# Patient Record
Sex: Female | Born: 1950
Health system: Southern US, Community
[De-identification: ages and names within clinical notes are randomized; demographics above are authoritative.]

## PROBLEM LIST (undated history)

## (undated) DIAGNOSIS — J449 Chronic obstructive pulmonary disease, unspecified: Secondary | ICD-10-CM

## (undated) DIAGNOSIS — F32A Depression, unspecified: Secondary | ICD-10-CM

## (undated) DIAGNOSIS — F329 Major depressive disorder, single episode, unspecified: Secondary | ICD-10-CM

## (undated) DIAGNOSIS — M8580 Other specified disorders of bone density and structure, unspecified site: Secondary | ICD-10-CM

## (undated) HISTORY — DX: Major depressive disorder, single episode, unspecified: F32.9

## (undated) HISTORY — PX: APPENDECTOMY: SHX54

## (undated) HISTORY — PX: ABDOMINAL HYSTERECTOMY: SHX81

## (undated) HISTORY — DX: Depression, unspecified: F32.A

## (undated) HISTORY — DX: Other specified disorders of bone density and structure, unspecified site: M85.80

---

## 1998-05-28 ENCOUNTER — Other Ambulatory Visit: Admission: RE | Admit: 1998-05-28 | Discharge: 1998-05-28 | Payer: Self-pay

## 1999-04-10 ENCOUNTER — Ambulatory Visit (HOSPITAL_COMMUNITY): Admission: RE | Admit: 1999-04-10 | Discharge: 1999-04-10 | Payer: Self-pay | Admitting: Gastroenterology

## 1999-06-17 ENCOUNTER — Other Ambulatory Visit: Admission: RE | Admit: 1999-06-17 | Discharge: 1999-06-17 | Payer: Self-pay | Admitting: *Deleted

## 2000-06-02 ENCOUNTER — Other Ambulatory Visit: Admission: RE | Admit: 2000-06-02 | Discharge: 2000-06-02 | Payer: Self-pay | Admitting: *Deleted

## 2001-06-24 ENCOUNTER — Other Ambulatory Visit: Admission: RE | Admit: 2001-06-24 | Discharge: 2001-06-24 | Payer: Self-pay | Admitting: *Deleted

## 2002-07-26 ENCOUNTER — Other Ambulatory Visit: Admission: RE | Admit: 2002-07-26 | Discharge: 2002-07-26 | Payer: Self-pay | Admitting: Gynecology

## 2003-08-01 ENCOUNTER — Other Ambulatory Visit: Admission: RE | Admit: 2003-08-01 | Discharge: 2003-08-01 | Payer: Self-pay | Admitting: Gynecology

## 2004-08-20 ENCOUNTER — Other Ambulatory Visit: Admission: RE | Admit: 2004-08-20 | Discharge: 2004-08-20 | Payer: Self-pay | Admitting: Gynecology

## 2005-09-02 ENCOUNTER — Other Ambulatory Visit: Admission: RE | Admit: 2005-09-02 | Discharge: 2005-09-02 | Payer: Self-pay | Admitting: Gynecology

## 2006-09-07 ENCOUNTER — Other Ambulatory Visit: Admission: RE | Admit: 2006-09-07 | Discharge: 2006-09-07 | Payer: Self-pay | Admitting: Gynecology

## 2014-07-06 ENCOUNTER — Other Ambulatory Visit: Payer: Self-pay | Admitting: Family Medicine

## 2014-07-06 DIAGNOSIS — R200 Anesthesia of skin: Secondary | ICD-10-CM

## 2014-07-17 ENCOUNTER — Ambulatory Visit
Admission: RE | Admit: 2014-07-17 | Discharge: 2014-07-17 | Disposition: A | Discharge status: Home / Self Care | Payer: Commercial Managed Care - HMO | Source: Ambulatory Visit | Attending: Family Medicine | Admitting: Family Medicine

## 2014-07-17 DIAGNOSIS — R200 Anesthesia of skin: Secondary | ICD-10-CM

## 2014-08-11 ENCOUNTER — Encounter: Payer: Self-pay | Admitting: Surgery

## 2014-08-21 ENCOUNTER — Encounter: Payer: Commercial Managed Care - HMO | Admitting: Surgery

## 2014-08-25 ENCOUNTER — Encounter: Payer: Self-pay | Admitting: Surgery

## 2014-08-28 ENCOUNTER — Ambulatory Visit (INDEPENDENT_AMBULATORY_CARE_PROVIDER_SITE_OTHER): Payer: Commercial Managed Care - HMO | Admitting: Surgery

## 2014-08-28 ENCOUNTER — Encounter: Payer: Self-pay | Admitting: Surgery

## 2014-08-28 VITALS — BP 136/90 | HR 66 | Ht 64.0 in | Wt 164.1 lb

## 2014-08-28 DIAGNOSIS — I739 Peripheral vascular disease, unspecified: Secondary | ICD-10-CM

## 2014-08-28 NOTE — Progress Notes (Signed)
Patient name: Grace Nelson MRN: [id number] DOB: January 21, 1951 Sex: female   Referred by: Marda Stalker  Reason for referral:  Chief Complaint  Patient presents with  . New Evaluation    eval decrease sensation questionable claudication    HISTORY OF PRESENT ILLNESS: This is a very pleasant 63 year old female who is referred today for a possible claudication.  The patient states that for the past year she has been complaining of foot issues.  She states that occasionally she will get numbness in both of her feet.  The left leg bothers her more so than the right.  Also she describes as feeling as if her feet her on fire.  There are no aggravating or relieving factors.  She does not endorse classic claudication symptoms.  She exercises regularly, 3 times a week.  The patient does have a history of smoking.  She does not endorse nonhealing wounds.  She denies having any swelling in her legs.  She states that she is not diabetic.  Infection I suffers from arthritis and occasional allergy for which he takes daily medication.  Past Medical History  Diagnosis Date  . Depression   . Osteopenia     Past Surgical History  Procedure Laterality Date  . Appendectomy    . Abdominal hysterectomy      History   Social History  . Marital Status: Legally Separated    Spouse Name: N/A    Number of Children: N/A  . Years of Education: N/A   Occupational History  . Not on file.   Social History Main Topics  . Smoking status: Current Every Day Smoker -- 0.75 packs/day    Types: Cigarettes  . Smokeless tobacco: Never Used  . Alcohol Use: No  . Drug Use: No  . Sexual Activity: Not on file   Other Topics Concern  . Not on file   Social History Narrative  . No narrative on file    Family History  Problem Relation Age of Onset  . Ulcers Mother     pancreatic bleeding ulcer  . Hypertension Mother   . Cancer Mother     colon  . COPD Father   . Cancer Father     lung  .  Heart disease Father     before age 17  . Heart attack Father   . Kidney disease Brother     Allergies as of 08/28/2014 - Review Complete 08/28/2014  Allergen Reaction Noted  . Nsaids  08/11/2014  . Sulfa antibiotics Hives 08/11/2014    Current Outpatient Prescriptions on File Prior to Visit  Medication Sig Dispense Refill  . acetaminophen (TYLENOL) 650 MG CR tablet Take 650 mg by mouth every 8 (eight) hours as needed for pain.      . cetirizine (ZYRTEC) 10 MG tablet Take 10 mg by mouth daily.       No current facility-administered medications on file prior to visit.     REVIEW OF SYSTEMS: Please see history of present illness, otherwise all systems negative  PHYSICAL EXAMINATION: General: The patient appears their stated age.  Vital signs are BP 136/90  Pulse 66  Ht 5\' 4"  (1.626 m)  Wt 164 lb 1.6 oz (74.435 kg)  BMI 28.15 kg/m2  SpO2 98% HEENT:  No gross abnormalities Pulmonary: Respirations are non-labored Musculoskeletal: There are no major deformities.   Neurologic: No focal weakness or paresthesias are detected, Skin: There are no ulcer or rashes noted. Psychiatric: The patient has normal  affect. Cardiovascular: Palpable posterior tibial pulse bilaterally.  No edema.  No ulcers.  Diagnostic Studies: I have reviewed her ultrasound.  She has triphasic waveforms bilaterally with normal ankle-brachial indices   Assessment:  Bilateral foot pain Plan: I do not think that the patient's symptoms are vascular in origin.  She has normal ankle-brachial indices with palpable pedal pulses.  In addition, she does not endorse classic claudication symptoms.  Although the patient is not diabetic, I would favor this being more neurogenic in origin.  This appears to be neuropathy in my opinion.  I have recommended a trial of Neurontin, however the patient states that her symptoms are not bad enough for her to want to take a medication.  She'll follow with me on an as-needed basis I  will contact her primary care physician should she decide to take Neurontin     V. Leia Alf, M.D. [facility] Office: [phone] Pager:  [phone]

## 2015-04-01 DIAGNOSIS — R21 Rash and other nonspecific skin eruption: Secondary | ICD-10-CM | POA: Diagnosis not present

## 2015-04-20 DIAGNOSIS — J069 Acute upper respiratory infection, unspecified: Secondary | ICD-10-CM | POA: Diagnosis not present

## 2015-07-12 DIAGNOSIS — E559 Vitamin D deficiency, unspecified: Secondary | ICD-10-CM | POA: Diagnosis not present

## 2015-07-12 DIAGNOSIS — Z0000 Encounter for general adult medical examination without abnormal findings: Secondary | ICD-10-CM | POA: Diagnosis not present

## 2015-07-13 DIAGNOSIS — G629 Polyneuropathy, unspecified: Secondary | ICD-10-CM | POA: Diagnosis not present

## 2015-07-13 DIAGNOSIS — E559 Vitamin D deficiency, unspecified: Secondary | ICD-10-CM | POA: Diagnosis not present

## 2015-07-13 DIAGNOSIS — Z0000 Encounter for general adult medical examination without abnormal findings: Secondary | ICD-10-CM | POA: Diagnosis not present

## 2015-07-13 DIAGNOSIS — Z23 Encounter for immunization: Secondary | ICD-10-CM | POA: Diagnosis not present

## 2015-07-13 DIAGNOSIS — F17210 Nicotine dependence, cigarettes, uncomplicated: Secondary | ICD-10-CM | POA: Diagnosis not present

## 2015-07-18 ENCOUNTER — Encounter: Payer: Self-pay | Admitting: Acute Care

## 2015-07-18 ENCOUNTER — Telehealth: Payer: Self-pay | Admitting: Acute Care

## 2015-07-18 NOTE — Telephone Encounter (Signed)
I have scheduled Grace Nelson for a lung cancer screening on 08/01/15 . She will have a shared decision making visit with me at 10 am and then have the scan at 11 am. She verbalized understanding of time and location of both appointments.She has my contact information in the event she needs to change the appointment or has any questions.

## 2015-07-20 ENCOUNTER — Other Ambulatory Visit: Payer: Self-pay | Admitting: Acute Care

## 2015-07-20 DIAGNOSIS — F17210 Nicotine dependence, cigarettes, uncomplicated: Principal | ICD-10-CM

## 2015-08-01 ENCOUNTER — Ambulatory Visit (INDEPENDENT_AMBULATORY_CARE_PROVIDER_SITE_OTHER)
Admission: RE | Admit: 2015-08-01 | Discharge: 2015-08-01 | Disposition: A | Discharge status: Home / Self Care | Payer: Commercial Managed Care - HMO | Source: Ambulatory Visit | Attending: Acute Care | Admitting: Acute Care

## 2015-08-01 ENCOUNTER — Encounter: Payer: Self-pay | Admitting: Acute Care

## 2015-08-01 ENCOUNTER — Ambulatory Visit (INDEPENDENT_AMBULATORY_CARE_PROVIDER_SITE_OTHER): Payer: Commercial Managed Care - HMO | Admitting: Acute Care

## 2015-08-01 DIAGNOSIS — F17210 Nicotine dependence, cigarettes, uncomplicated: Secondary | ICD-10-CM | POA: Diagnosis not present

## 2015-08-01 DIAGNOSIS — Z87891 Personal history of nicotine dependence: Secondary | ICD-10-CM | POA: Diagnosis not present

## 2015-08-01 NOTE — Progress Notes (Signed)
Shared Decision Making Visit Lung Cancer Screening Program [phone])   Eligibility:  Age 64 y.o.  Pack Years Smoking History Calculation 45 pack years (# packs/per year x # years smoked)  Recent History of coughing up blood  no  Unexplained weight loss? no ( >Than 15 pounds within the last 6 months )  Prior History Lung / other cancer no (Diagnosis within the last 5 years already requiring surveillance chest CT Scans).  Smoking Status Current Smoker  Former Smokers: Years since quit: NA  Quit Date: NA  Visit Components:  Discussion included one or more decision making aids. yes  Discussion included risk/benefits of screening. yes  Discussion included potential follow up diagnostic testing for abnormal scans. yes  Discussion included meaning and risk of over diagnosis. yes  Discussion included meaning and risk of False Positives. yes  Discussion included meaning of total radiation exposure. yes  Counseling Included:  Importance of adherence to annual lung cancer LDCT screening. yes  Impact of comorbidities on ability to participate in the program. yes  Ability and willingness to under diagnostic treatment. yes  Smoking Cessation Counseling:  Current Smokers:   Discussed importance of smoking cessation. yes  Information about tobacco cessation classes and interventions provided to patient. yes  Patient provided with "ticket" for LDCT Scan. yes  Symptomatic Patient. yes  Counseling: No  Diagnosis Code: Tobacco Use Z72.0  Asymptomatic Patient yes  Counseling (Intermediate counseling: > three minutes counseling) X5170  Former Smokers: NA, Current smoker  Discussed the importance of maintaining cigarette abstinence. NA  Diagnosis Code: Personal History of Nicotine Dependence. Y17.494  Information about tobacco cessation classes and interventions provided to patient. NA  Patient provided with "ticket" for LDCT Scan. yes  Written Order for Lung Cancer  Screening with LDCT placed in Epic. Yes (CT Chest Lung Cancer Screening Low Dose W/O CM) [id number] Z12.2-Screening of respiratory organs Z87.891-Personal history of nicotine dependence  I spent 15 minutes of face to face time with Ms. Furguson discussing the risks and benefits of the  lung cancer screening program. We viewed a power point together, pausing and stopping at intervals to allow for questions to be asked and answered to ensure understanding of all content topic notes above. We discussed the fact that the most powerful thing she can do to decrease her chance of developing lung cancer is to quit smoking. I have given her the " Be stronger than your excuses" card and we discussed nicotine replacement therapy. She has tried the patches which caused contact blistering, and the mints which made her nauseated. We discussed the use of Wellbutrin and Chantix. She has tried Wellbutrin unsuccessfully, and is considering the use of Chantix. She is working with her PCP on this. I also suggested the nicotine aerosol as an option. I have told her if she needs help with prescriptions to help with meeting this goal to let me know. I have given her a copy of the power point we have viewed together to refer to in the future, and my card with my contact information. She verbalized understanding of the time and location of her scan, and all of the above information noted above. She had no further questions upon leaving my office. She is very supportive of this program as she lost her best friend to lung cancer that was diagnosed at a late stage in April.   Magdalen Spatz, NP

## 2015-08-03 DIAGNOSIS — Z780 Asymptomatic menopausal state: Secondary | ICD-10-CM | POA: Diagnosis not present

## 2015-08-03 DIAGNOSIS — Z1231 Encounter for screening mammogram for malignant neoplasm of breast: Secondary | ICD-10-CM | POA: Diagnosis not present

## 2015-08-15 ENCOUNTER — Ambulatory Visit (INDEPENDENT_AMBULATORY_CARE_PROVIDER_SITE_OTHER): Payer: Commercial Managed Care - HMO | Admitting: Neurology

## 2015-08-15 DIAGNOSIS — R202 Paresthesia of skin: Secondary | ICD-10-CM

## 2015-08-15 DIAGNOSIS — Z0289 Encounter for other administrative examinations: Secondary | ICD-10-CM

## 2015-08-15 NOTE — Progress Notes (Signed)
  [facility]    Provider:  Dr [name] Referring Provider: [name], MD Primary Care Physician:  [name], MD  HPI:  Grace Nelson is a 64 y.o. female here as a referral from Dr. Sabra Heck for peripheral neuropathy.  Symptoms started 2 years ago. Her feet burn, they feel hot, painful. Just the bottom of the feet. Balance is fine. She exercises three times a week. No weakness. She has rare cramping at night. Feet are worse at night.   Summary  Nerve conduction studies were performed on the bilateral lower extremities:  The bilateral Peroneal motor nerves showed normal conductions with normal F Wave latencies The bilateral Tibial motor nerves showed normal conductions with normal F Wave latencies The bilateral Sural sensory nerve conductions were within normal limits Bilateral H Reflexes showed normal latencies   EMG Needle study was performed on selected lower extremity muscles:   The Vastus Medialis, Anterior Tibialis, Medial Gastrocnemius, Extensor Hallucis Longus and Abductor Hallucis muscles were within normal limits bilaterally.  Conclusion: This is a normal study. There is no evidence of polyneuropathy however a small fiber neuropathy could be responsible for patient's symptoms and still evade detection by this study. Clinical correlation recommended.     Assessment/Plan:    [name], MD  [facility] [address] [place], [place] [address]  Phone [phone] Fax [phone]

## 2015-08-16 NOTE — Procedures (Signed)
[facility]    Provider:  Dr [name] Referring Provider: [name], MD Primary Care Physician:  [name], MD  HPI:  Grace Nelson is a 64 y.o. female here as a referral from Dr. Sabra Heck for peripheral neuropathy.  Symptoms started 2 years ago. Her feet burn, they feel hot, painful. Just the bottom of the feet. Balance is fine. She exercises three times a week. No weakness. She has rare cramping at night. Feet are worse at night.   Summary  Nerve conduction studies were performed on the bilateral lower extremities:  The bilateral Peroneal motor nerves showed normal conductions with normal F Wave latencies The bilateral Tibial motor nerves showed normal conductions with normal F Wave latencies The bilateral Sural sensory nerve conductions were within normal limits Bilateral H Reflexes showed normal latencies   EMG Needle study was performed on selected lower extremity muscles:   The Vastus Medialis, Anterior Tibialis, Medial Gastrocnemius, Extensor Hallucis Longus and Abductor Hallucis muscles were within normal limits bilaterally.  Conclusion: This is a normal study. There is no evidence of polyneuropathy however a small fiber neuropathy could be responsible for patient's symptoms and still evade detection by this study. Clinical correlation recommended.     [name], MD  [facility] [address] [place], [place] [address]  Phone [phone] Fax [phone]

## 2015-08-16 NOTE — Progress Notes (Signed)
See procedure note.

## 2015-11-13 DIAGNOSIS — E785 Hyperlipidemia, unspecified: Secondary | ICD-10-CM | POA: Diagnosis not present

## 2015-11-19 DIAGNOSIS — R197 Diarrhea, unspecified: Secondary | ICD-10-CM | POA: Diagnosis not present

## 2015-11-19 DIAGNOSIS — E785 Hyperlipidemia, unspecified: Secondary | ICD-10-CM | POA: Diagnosis not present

## 2015-11-19 DIAGNOSIS — R195 Other fecal abnormalities: Secondary | ICD-10-CM | POA: Diagnosis not present

## 2015-11-20 DIAGNOSIS — R195 Other fecal abnormalities: Secondary | ICD-10-CM | POA: Diagnosis not present

## 2016-02-20 ENCOUNTER — Other Ambulatory Visit: Payer: Self-pay | Admitting: Acute Care

## 2016-02-20 DIAGNOSIS — F17210 Nicotine dependence, cigarettes, uncomplicated: Principal | ICD-10-CM

## 2016-07-31 DIAGNOSIS — F17210 Nicotine dependence, cigarettes, uncomplicated: Secondary | ICD-10-CM | POA: Diagnosis not present

## 2016-07-31 DIAGNOSIS — Z23 Encounter for immunization: Secondary | ICD-10-CM | POA: Diagnosis not present

## 2016-07-31 DIAGNOSIS — I2510 Atherosclerotic heart disease of native coronary artery without angina pectoris: Secondary | ICD-10-CM | POA: Diagnosis not present

## 2016-07-31 DIAGNOSIS — Z79899 Other long term (current) drug therapy: Secondary | ICD-10-CM | POA: Diagnosis not present

## 2016-07-31 DIAGNOSIS — Z1159 Encounter for screening for other viral diseases: Secondary | ICD-10-CM | POA: Diagnosis not present

## 2016-07-31 DIAGNOSIS — Z86010 Personal history of colonic polyps: Secondary | ICD-10-CM | POA: Diagnosis not present

## 2016-07-31 DIAGNOSIS — M8580 Other specified disorders of bone density and structure, unspecified site: Secondary | ICD-10-CM | POA: Diagnosis not present

## 2016-07-31 DIAGNOSIS — E559 Vitamin D deficiency, unspecified: Secondary | ICD-10-CM | POA: Diagnosis not present

## 2016-07-31 DIAGNOSIS — Z0000 Encounter for general adult medical examination without abnormal findings: Secondary | ICD-10-CM | POA: Diagnosis not present

## 2016-08-01 ENCOUNTER — Ambulatory Visit (INDEPENDENT_AMBULATORY_CARE_PROVIDER_SITE_OTHER)
Admission: RE | Admit: 2016-08-01 | Discharge: 2016-08-01 | Disposition: A | Discharge status: Home / Self Care | Payer: Commercial Managed Care - HMO | Source: Ambulatory Visit | Attending: Acute Care | Admitting: Acute Care

## 2016-08-01 DIAGNOSIS — Z87891 Personal history of nicotine dependence: Secondary | ICD-10-CM | POA: Diagnosis not present

## 2016-08-01 DIAGNOSIS — F17210 Nicotine dependence, cigarettes, uncomplicated: Principal | ICD-10-CM

## 2016-08-06 ENCOUNTER — Telehealth: Payer: Self-pay | Admitting: Acute Care

## 2016-08-06 DIAGNOSIS — F17210 Nicotine dependence, cigarettes, uncomplicated: Principal | ICD-10-CM

## 2016-08-06 NOTE — Telephone Encounter (Signed)
I attempted to call Mr. Rabkin with her low-dose CT screening results. There was no answer at either her home or mobile cell number. I have left a message with contact information requesting that she call the office for the results. We will await her return call.

## 2016-08-06 NOTE — Telephone Encounter (Signed)
Grace Nelson returned our call. I have given her the results of her low-dose CT screening. I explained to her that her scan was read as a Lung RADS 2: Indicating nodules that are benign in appearance and behavior with a very low likelihood of becoming a clinically active cancer due to size or lack of growth. Recommendation per radiology is for a repeat LDCT in 12 months. I explained to her that we would schedule her annual scan in 12 months. I additionally explained to her that her scan indicated two-vessel coronary artery atherosclerosis. This reported actually been given in her previous year scan, and subsequently her primary care physician Dr. [name] had initiated statin therapy. Patient verbalized understanding of the above and had no further questions or concerns at the conclusion of the call.

## 2016-08-08 DIAGNOSIS — Z1231 Encounter for screening mammogram for malignant neoplasm of breast: Secondary | ICD-10-CM | POA: Diagnosis not present

## 2017-08-04 ENCOUNTER — Ambulatory Visit (INDEPENDENT_AMBULATORY_CARE_PROVIDER_SITE_OTHER)
Admission: RE | Admit: 2017-08-04 | Discharge: 2017-08-04 | Disposition: A | Discharge status: Home / Self Care | Payer: Medicare HMO | Source: Ambulatory Visit | Attending: Acute Care | Admitting: Acute Care

## 2017-08-04 DIAGNOSIS — J439 Emphysema, unspecified: Secondary | ICD-10-CM | POA: Diagnosis not present

## 2017-08-04 DIAGNOSIS — F17210 Nicotine dependence, cigarettes, uncomplicated: Secondary | ICD-10-CM | POA: Diagnosis not present

## 2017-08-05 DIAGNOSIS — I700 Atherosclerosis of aorta: Secondary | ICD-10-CM | POA: Diagnosis not present

## 2017-08-05 DIAGNOSIS — E559 Vitamin D deficiency, unspecified: Secondary | ICD-10-CM | POA: Diagnosis not present

## 2017-08-05 DIAGNOSIS — Z79899 Other long term (current) drug therapy: Secondary | ICD-10-CM | POA: Diagnosis not present

## 2017-08-05 DIAGNOSIS — R8290 Unspecified abnormal findings in urine: Secondary | ICD-10-CM | POA: Diagnosis not present

## 2017-08-05 DIAGNOSIS — Z0000 Encounter for general adult medical examination without abnormal findings: Secondary | ICD-10-CM | POA: Diagnosis not present

## 2017-08-05 DIAGNOSIS — I2510 Atherosclerotic heart disease of native coronary artery without angina pectoris: Secondary | ICD-10-CM | POA: Diagnosis not present

## 2017-08-05 DIAGNOSIS — R635 Abnormal weight gain: Secondary | ICD-10-CM | POA: Diagnosis not present

## 2017-08-05 DIAGNOSIS — Z23 Encounter for immunization: Secondary | ICD-10-CM | POA: Diagnosis not present

## 2017-08-05 DIAGNOSIS — Z1389 Encounter for screening for other disorder: Secondary | ICD-10-CM | POA: Diagnosis not present

## 2017-08-07 ENCOUNTER — Other Ambulatory Visit: Payer: Self-pay | Admitting: Acute Care

## 2017-08-07 DIAGNOSIS — Z122 Encounter for screening for malignant neoplasm of respiratory organs: Secondary | ICD-10-CM

## 2017-08-07 DIAGNOSIS — F17210 Nicotine dependence, cigarettes, uncomplicated: Principal | ICD-10-CM

## 2017-08-13 DIAGNOSIS — Z1231 Encounter for screening mammogram for malignant neoplasm of breast: Secondary | ICD-10-CM | POA: Diagnosis not present

## 2017-08-18 ENCOUNTER — Other Ambulatory Visit: Payer: Self-pay | Admitting: Internal Medicine

## 2017-08-18 DIAGNOSIS — R0600 Dyspnea, unspecified: Secondary | ICD-10-CM

## 2017-08-19 ENCOUNTER — Ambulatory Visit (INDEPENDENT_AMBULATORY_CARE_PROVIDER_SITE_OTHER): Payer: Medicare HMO | Admitting: Internal Medicine

## 2017-08-19 DIAGNOSIS — R0600 Dyspnea, unspecified: Secondary | ICD-10-CM

## 2017-08-19 LAB — PULMONARY FUNCTION TEST
DL/VA % pred: 42 %
DL/VA: 2.03 ml/min/mmHg/L
DLCO COR: 8.2 ml/min/mmHg
DLCO cor % pred: 33 %
DLCO unc % pred: 35 %
DLCO unc: 8.51 ml/min/mmHg
FEF 25-75 Post: 0.55 L/sec
FEF 25-75 Pre: 0.48 L/sec
FEF2575-%Change-Post: 12 %
FEF2575-%PRED-PRE: 24 %
FEF2575-%Pred-Post: 27 %
FEV1-%Change-Post: -1 %
FEV1-%PRED-PRE: 53 %
FEV1-%Pred-Post: 53 %
FEV1-POST: 1.23 L
FEV1-PRE: 1.25 L
FEV1FVC-%Change-Post: -1 %
FEV1FVC-%Pred-Pre: 79 %
FEV6-%Change-Post: 0 %
FEV6-%PRED-PRE: 68 %
FEV6-%Pred-Post: 68 %
FEV6-POST: 2.01 L
FEV6-Pre: 2.01 L
FEV6FVC-%Change-Post: 0 %
FEV6FVC-%PRED-PRE: 101 %
FEV6FVC-%Pred-Post: 101 %
FVC-%Change-Post: 0 %
FVC-%PRED-POST: 67 %
FVC-%PRED-PRE: 67 %
FVC-POST: 2.07 L
FVC-PRE: 2.07 L
POST FEV6/FVC RATIO: 97 %
PRE FEV1/FVC RATIO: 60 %
Post FEV1/FVC ratio: 59 %
Pre FEV6/FVC Ratio: 97 %
RV % PRED: 146 %
RV: 3.15 L
TLC % PRED: 106 %
TLC: 5.37 L

## 2017-08-19 NOTE — Progress Notes (Signed)
PFT done today. 

## 2018-01-13 DIAGNOSIS — M7501 Adhesive capsulitis of right shoulder: Secondary | ICD-10-CM | POA: Diagnosis not present

## 2018-01-13 DIAGNOSIS — M25519 Pain in unspecified shoulder: Secondary | ICD-10-CM | POA: Diagnosis not present

## 2018-01-14 DIAGNOSIS — M79621 Pain in right upper arm: Secondary | ICD-10-CM | POA: Diagnosis not present

## 2018-01-14 DIAGNOSIS — M7501 Adhesive capsulitis of right shoulder: Secondary | ICD-10-CM | POA: Diagnosis not present

## 2018-01-14 DIAGNOSIS — M25611 Stiffness of right shoulder, not elsewhere classified: Secondary | ICD-10-CM | POA: Diagnosis not present

## 2018-01-14 DIAGNOSIS — M25511 Pain in right shoulder: Secondary | ICD-10-CM | POA: Diagnosis not present

## 2018-01-19 DIAGNOSIS — M25611 Stiffness of right shoulder, not elsewhere classified: Secondary | ICD-10-CM | POA: Diagnosis not present

## 2018-01-19 DIAGNOSIS — M25511 Pain in right shoulder: Secondary | ICD-10-CM | POA: Diagnosis not present

## 2018-01-19 DIAGNOSIS — M79621 Pain in right upper arm: Secondary | ICD-10-CM | POA: Diagnosis not present

## 2018-01-19 DIAGNOSIS — M7501 Adhesive capsulitis of right shoulder: Secondary | ICD-10-CM | POA: Diagnosis not present

## 2018-01-21 DIAGNOSIS — M79621 Pain in right upper arm: Secondary | ICD-10-CM | POA: Diagnosis not present

## 2018-01-21 DIAGNOSIS — M25611 Stiffness of right shoulder, not elsewhere classified: Secondary | ICD-10-CM | POA: Diagnosis not present

## 2018-01-21 DIAGNOSIS — M25511 Pain in right shoulder: Secondary | ICD-10-CM | POA: Diagnosis not present

## 2018-01-21 DIAGNOSIS — M7501 Adhesive capsulitis of right shoulder: Secondary | ICD-10-CM | POA: Diagnosis not present

## 2018-01-26 DIAGNOSIS — M25511 Pain in right shoulder: Secondary | ICD-10-CM | POA: Diagnosis not present

## 2018-01-26 DIAGNOSIS — M7501 Adhesive capsulitis of right shoulder: Secondary | ICD-10-CM | POA: Diagnosis not present

## 2018-01-26 DIAGNOSIS — M79621 Pain in right upper arm: Secondary | ICD-10-CM | POA: Diagnosis not present

## 2018-01-26 DIAGNOSIS — M25611 Stiffness of right shoulder, not elsewhere classified: Secondary | ICD-10-CM | POA: Diagnosis not present

## 2018-01-27 DIAGNOSIS — M25611 Stiffness of right shoulder, not elsewhere classified: Secondary | ICD-10-CM | POA: Diagnosis not present

## 2018-01-27 DIAGNOSIS — M7501 Adhesive capsulitis of right shoulder: Secondary | ICD-10-CM | POA: Diagnosis not present

## 2018-01-27 DIAGNOSIS — M25511 Pain in right shoulder: Secondary | ICD-10-CM | POA: Diagnosis not present

## 2018-01-27 DIAGNOSIS — M79621 Pain in right upper arm: Secondary | ICD-10-CM | POA: Diagnosis not present

## 2018-02-03 DIAGNOSIS — M79621 Pain in right upper arm: Secondary | ICD-10-CM | POA: Diagnosis not present

## 2018-02-03 DIAGNOSIS — M7501 Adhesive capsulitis of right shoulder: Secondary | ICD-10-CM | POA: Diagnosis not present

## 2018-02-03 DIAGNOSIS — M25511 Pain in right shoulder: Secondary | ICD-10-CM | POA: Diagnosis not present

## 2018-02-03 DIAGNOSIS — M25611 Stiffness of right shoulder, not elsewhere classified: Secondary | ICD-10-CM | POA: Diagnosis not present

## 2018-02-05 DIAGNOSIS — M25611 Stiffness of right shoulder, not elsewhere classified: Secondary | ICD-10-CM | POA: Diagnosis not present

## 2018-02-05 DIAGNOSIS — M7501 Adhesive capsulitis of right shoulder: Secondary | ICD-10-CM | POA: Diagnosis not present

## 2018-02-05 DIAGNOSIS — M25511 Pain in right shoulder: Secondary | ICD-10-CM | POA: Diagnosis not present

## 2018-02-05 DIAGNOSIS — M79621 Pain in right upper arm: Secondary | ICD-10-CM | POA: Diagnosis not present

## 2018-02-08 DIAGNOSIS — M7501 Adhesive capsulitis of right shoulder: Secondary | ICD-10-CM | POA: Diagnosis not present

## 2018-02-08 DIAGNOSIS — M79621 Pain in right upper arm: Secondary | ICD-10-CM | POA: Diagnosis not present

## 2018-02-08 DIAGNOSIS — M25511 Pain in right shoulder: Secondary | ICD-10-CM | POA: Diagnosis not present

## 2018-02-08 DIAGNOSIS — M25611 Stiffness of right shoulder, not elsewhere classified: Secondary | ICD-10-CM | POA: Diagnosis not present

## 2018-02-10 DIAGNOSIS — M7501 Adhesive capsulitis of right shoulder: Secondary | ICD-10-CM | POA: Diagnosis not present

## 2018-02-12 DIAGNOSIS — M25511 Pain in right shoulder: Secondary | ICD-10-CM | POA: Diagnosis not present

## 2018-02-12 DIAGNOSIS — M7501 Adhesive capsulitis of right shoulder: Secondary | ICD-10-CM | POA: Diagnosis not present

## 2018-02-12 DIAGNOSIS — M79621 Pain in right upper arm: Secondary | ICD-10-CM | POA: Diagnosis not present

## 2018-02-12 DIAGNOSIS — M25611 Stiffness of right shoulder, not elsewhere classified: Secondary | ICD-10-CM | POA: Diagnosis not present

## 2018-04-05 DIAGNOSIS — L814 Other melanin hyperpigmentation: Secondary | ICD-10-CM | POA: Diagnosis not present

## 2018-04-05 DIAGNOSIS — D2362 Other benign neoplasm of skin of left upper limb, including shoulder: Secondary | ICD-10-CM | POA: Diagnosis not present

## 2018-04-05 DIAGNOSIS — L821 Other seborrheic keratosis: Secondary | ICD-10-CM | POA: Diagnosis not present

## 2018-04-05 DIAGNOSIS — C44311 Basal cell carcinoma of skin of nose: Secondary | ICD-10-CM | POA: Diagnosis not present

## 2018-04-05 DIAGNOSIS — D225 Melanocytic nevi of trunk: Secondary | ICD-10-CM | POA: Diagnosis not present

## 2018-04-05 DIAGNOSIS — D234 Other benign neoplasm of skin of scalp and neck: Secondary | ICD-10-CM | POA: Diagnosis not present

## 2018-04-05 DIAGNOSIS — L820 Inflamed seborrheic keratosis: Secondary | ICD-10-CM | POA: Diagnosis not present

## 2018-04-05 DIAGNOSIS — D2361 Other benign neoplasm of skin of right upper limb, including shoulder: Secondary | ICD-10-CM | POA: Diagnosis not present

## 2018-04-05 DIAGNOSIS — D2372 Other benign neoplasm of skin of left lower limb, including hip: Secondary | ICD-10-CM | POA: Diagnosis not present

## 2018-04-20 DIAGNOSIS — N951 Menopausal and female climacteric states: Secondary | ICD-10-CM | POA: Diagnosis not present

## 2018-04-20 DIAGNOSIS — R635 Abnormal weight gain: Secondary | ICD-10-CM | POA: Diagnosis not present

## 2018-04-22 DIAGNOSIS — Z6831 Body mass index (BMI) 31.0-31.9, adult: Secondary | ICD-10-CM | POA: Diagnosis not present

## 2018-04-22 DIAGNOSIS — Z1339 Encounter for screening examination for other mental health and behavioral disorders: Secondary | ICD-10-CM | POA: Diagnosis not present

## 2018-04-22 DIAGNOSIS — G479 Sleep disorder, unspecified: Secondary | ICD-10-CM | POA: Diagnosis not present

## 2018-04-22 DIAGNOSIS — Z8639 Personal history of other endocrine, nutritional and metabolic disease: Secondary | ICD-10-CM | POA: Diagnosis not present

## 2018-04-22 DIAGNOSIS — N898 Other specified noninflammatory disorders of vagina: Secondary | ICD-10-CM | POA: Diagnosis not present

## 2018-04-22 DIAGNOSIS — R5383 Other fatigue: Secondary | ICD-10-CM | POA: Diagnosis not present

## 2018-04-22 DIAGNOSIS — Z1331 Encounter for screening for depression: Secondary | ICD-10-CM | POA: Diagnosis not present

## 2018-04-22 DIAGNOSIS — N951 Menopausal and female climacteric states: Secondary | ICD-10-CM | POA: Diagnosis not present

## 2018-04-22 DIAGNOSIS — E782 Mixed hyperlipidemia: Secondary | ICD-10-CM | POA: Diagnosis not present

## 2018-04-29 DIAGNOSIS — Z8639 Personal history of other endocrine, nutritional and metabolic disease: Secondary | ICD-10-CM | POA: Diagnosis not present

## 2018-04-29 DIAGNOSIS — E782 Mixed hyperlipidemia: Secondary | ICD-10-CM | POA: Diagnosis not present

## 2018-04-29 DIAGNOSIS — Z6831 Body mass index (BMI) 31.0-31.9, adult: Secondary | ICD-10-CM | POA: Diagnosis not present

## 2018-04-29 DIAGNOSIS — Z713 Dietary counseling and surveillance: Secondary | ICD-10-CM | POA: Diagnosis not present

## 2018-05-04 DIAGNOSIS — C44311 Basal cell carcinoma of skin of nose: Secondary | ICD-10-CM | POA: Diagnosis not present

## 2018-05-04 DIAGNOSIS — Z85828 Personal history of other malignant neoplasm of skin: Secondary | ICD-10-CM | POA: Diagnosis not present

## 2018-05-06 DIAGNOSIS — Z8639 Personal history of other endocrine, nutritional and metabolic disease: Secondary | ICD-10-CM | POA: Diagnosis not present

## 2018-05-06 DIAGNOSIS — Z6831 Body mass index (BMI) 31.0-31.9, adult: Secondary | ICD-10-CM | POA: Diagnosis not present

## 2018-05-06 DIAGNOSIS — E669 Obesity, unspecified: Secondary | ICD-10-CM | POA: Diagnosis not present

## 2018-05-06 DIAGNOSIS — Z713 Dietary counseling and surveillance: Secondary | ICD-10-CM | POA: Diagnosis not present

## 2018-05-13 DIAGNOSIS — Z713 Dietary counseling and surveillance: Secondary | ICD-10-CM | POA: Diagnosis not present

## 2018-05-13 DIAGNOSIS — E669 Obesity, unspecified: Secondary | ICD-10-CM | POA: Diagnosis not present

## 2018-05-13 DIAGNOSIS — Z4802 Encounter for removal of sutures: Secondary | ICD-10-CM | POA: Diagnosis not present

## 2018-05-13 DIAGNOSIS — Z6831 Body mass index (BMI) 31.0-31.9, adult: Secondary | ICD-10-CM | POA: Diagnosis not present

## 2018-05-13 DIAGNOSIS — Z8639 Personal history of other endocrine, nutritional and metabolic disease: Secondary | ICD-10-CM | POA: Diagnosis not present

## 2018-05-20 DIAGNOSIS — Z713 Dietary counseling and surveillance: Secondary | ICD-10-CM | POA: Diagnosis not present

## 2018-05-20 DIAGNOSIS — Z8639 Personal history of other endocrine, nutritional and metabolic disease: Secondary | ICD-10-CM | POA: Diagnosis not present

## 2018-05-20 DIAGNOSIS — R5383 Other fatigue: Secondary | ICD-10-CM | POA: Diagnosis not present

## 2018-05-20 DIAGNOSIS — Z6831 Body mass index (BMI) 31.0-31.9, adult: Secondary | ICD-10-CM | POA: Diagnosis not present

## 2018-05-27 DIAGNOSIS — I7090 Unspecified atherosclerosis: Secondary | ICD-10-CM | POA: Diagnosis not present

## 2018-05-27 DIAGNOSIS — Z6831 Body mass index (BMI) 31.0-31.9, adult: Secondary | ICD-10-CM | POA: Diagnosis not present

## 2018-05-27 DIAGNOSIS — Z713 Dietary counseling and surveillance: Secondary | ICD-10-CM | POA: Diagnosis not present

## 2018-08-05 ENCOUNTER — Ambulatory Visit (INDEPENDENT_AMBULATORY_CARE_PROVIDER_SITE_OTHER)
Admission: RE | Admit: 2018-08-05 | Discharge: 2018-08-05 | Disposition: A | Discharge status: Home / Self Care | Payer: Medicare HMO | Source: Ambulatory Visit | Attending: Acute Care | Admitting: Acute Care

## 2018-08-05 DIAGNOSIS — Z87891 Personal history of nicotine dependence: Secondary | ICD-10-CM | POA: Diagnosis not present

## 2018-08-05 DIAGNOSIS — Z122 Encounter for screening for malignant neoplasm of respiratory organs: Secondary | ICD-10-CM

## 2018-08-05 DIAGNOSIS — F17210 Nicotine dependence, cigarettes, uncomplicated: Secondary | ICD-10-CM | POA: Diagnosis not present

## 2018-08-09 ENCOUNTER — Telehealth: Payer: Self-pay | Admitting: Acute Care

## 2018-08-09 DIAGNOSIS — Z122 Encounter for screening for malignant neoplasm of respiratory organs: Secondary | ICD-10-CM

## 2018-08-09 DIAGNOSIS — F17210 Nicotine dependence, cigarettes, uncomplicated: Principal | ICD-10-CM

## 2018-08-10 NOTE — Telephone Encounter (Signed)
Patient is requesting results and information from lung cancer screening. I will route this to DP to follow up on.

## 2018-08-12 NOTE — Telephone Encounter (Signed)
Pt informed of CT results per Sarah Groce, NP.  PT verbalized understanding.  Copy sent to PCP.  Order placed for 1 yr f/u CT.  

## 2018-08-17 DIAGNOSIS — Z1231 Encounter for screening mammogram for malignant neoplasm of breast: Secondary | ICD-10-CM | POA: Diagnosis not present

## 2018-08-24 DIAGNOSIS — I700 Atherosclerosis of aorta: Secondary | ICD-10-CM | POA: Diagnosis not present

## 2018-08-24 DIAGNOSIS — Z0000 Encounter for general adult medical examination without abnormal findings: Secondary | ICD-10-CM | POA: Diagnosis not present

## 2018-08-24 DIAGNOSIS — Z79899 Other long term (current) drug therapy: Secondary | ICD-10-CM | POA: Diagnosis not present

## 2018-08-24 DIAGNOSIS — Z6830 Body mass index (BMI) 30.0-30.9, adult: Secondary | ICD-10-CM | POA: Diagnosis not present

## 2018-08-24 DIAGNOSIS — Z1389 Encounter for screening for other disorder: Secondary | ICD-10-CM | POA: Diagnosis not present

## 2018-08-24 DIAGNOSIS — I2510 Atherosclerotic heart disease of native coronary artery without angina pectoris: Secondary | ICD-10-CM | POA: Diagnosis not present

## 2018-08-24 DIAGNOSIS — E559 Vitamin D deficiency, unspecified: Secondary | ICD-10-CM | POA: Diagnosis not present

## 2018-08-24 DIAGNOSIS — Z86010 Personal history of colonic polyps: Secondary | ICD-10-CM | POA: Diagnosis not present

## 2018-08-24 DIAGNOSIS — Z23 Encounter for immunization: Secondary | ICD-10-CM | POA: Diagnosis not present

## 2019-04-20 DIAGNOSIS — H524 Presbyopia: Secondary | ICD-10-CM | POA: Diagnosis not present

## 2019-04-20 DIAGNOSIS — H52223 Regular astigmatism, bilateral: Secondary | ICD-10-CM | POA: Diagnosis not present

## 2019-08-23 DIAGNOSIS — Z1231 Encounter for screening mammogram for malignant neoplasm of breast: Secondary | ICD-10-CM | POA: Diagnosis not present

## 2019-08-26 ENCOUNTER — Other Ambulatory Visit: Payer: Self-pay

## 2019-08-26 ENCOUNTER — Encounter (INDEPENDENT_AMBULATORY_CARE_PROVIDER_SITE_OTHER): Payer: Self-pay

## 2019-08-26 ENCOUNTER — Ambulatory Visit (INDEPENDENT_AMBULATORY_CARE_PROVIDER_SITE_OTHER)
Admission: RE | Admit: 2019-08-26 | Discharge: 2019-08-26 | Disposition: A | Discharge status: Home / Self Care | Payer: Medicare HMO | Source: Ambulatory Visit | Attending: Acute Care | Admitting: Acute Care

## 2019-08-26 DIAGNOSIS — F17210 Nicotine dependence, cigarettes, uncomplicated: Secondary | ICD-10-CM

## 2019-08-26 DIAGNOSIS — Z122 Encounter for screening for malignant neoplasm of respiratory organs: Secondary | ICD-10-CM

## 2019-09-01 ENCOUNTER — Other Ambulatory Visit: Payer: Self-pay | Admitting: *Deleted

## 2019-09-01 DIAGNOSIS — Z122 Encounter for screening for malignant neoplasm of respiratory organs: Secondary | ICD-10-CM

## 2019-09-01 DIAGNOSIS — Z87891 Personal history of nicotine dependence: Secondary | ICD-10-CM

## 2019-09-01 DIAGNOSIS — F17210 Nicotine dependence, cigarettes, uncomplicated: Secondary | ICD-10-CM

## 2019-09-14 DIAGNOSIS — Z86010 Personal history of colonic polyps: Secondary | ICD-10-CM | POA: Diagnosis not present

## 2019-09-14 DIAGNOSIS — E669 Obesity, unspecified: Secondary | ICD-10-CM | POA: Diagnosis not present

## 2019-09-14 DIAGNOSIS — I700 Atherosclerosis of aorta: Secondary | ICD-10-CM | POA: Diagnosis not present

## 2019-09-14 DIAGNOSIS — Z79899 Other long term (current) drug therapy: Secondary | ICD-10-CM | POA: Diagnosis not present

## 2019-09-14 DIAGNOSIS — Z0000 Encounter for general adult medical examination without abnormal findings: Secondary | ICD-10-CM | POA: Diagnosis not present

## 2019-09-14 DIAGNOSIS — Z6830 Body mass index (BMI) 30.0-30.9, adult: Secondary | ICD-10-CM | POA: Diagnosis not present

## 2019-09-14 DIAGNOSIS — F17210 Nicotine dependence, cigarettes, uncomplicated: Secondary | ICD-10-CM | POA: Diagnosis not present

## 2019-09-14 DIAGNOSIS — Z23 Encounter for immunization: Secondary | ICD-10-CM | POA: Diagnosis not present

## 2019-09-14 DIAGNOSIS — M8580 Other specified disorders of bone density and structure, unspecified site: Secondary | ICD-10-CM | POA: Diagnosis not present

## 2019-11-08 DIAGNOSIS — H00025 Hordeolum internum left lower eyelid: Secondary | ICD-10-CM | POA: Diagnosis not present

## 2019-12-09 DIAGNOSIS — D225 Melanocytic nevi of trunk: Secondary | ICD-10-CM | POA: Diagnosis not present

## 2019-12-09 DIAGNOSIS — D1801 Hemangioma of skin and subcutaneous tissue: Secondary | ICD-10-CM | POA: Diagnosis not present

## 2019-12-09 DIAGNOSIS — L570 Actinic keratosis: Secondary | ICD-10-CM | POA: Diagnosis not present

## 2019-12-09 DIAGNOSIS — D2262 Melanocytic nevi of left upper limb, including shoulder: Secondary | ICD-10-CM | POA: Diagnosis not present

## 2019-12-09 DIAGNOSIS — L821 Other seborrheic keratosis: Secondary | ICD-10-CM | POA: Diagnosis not present

## 2019-12-09 DIAGNOSIS — C44519 Basal cell carcinoma of skin of other part of trunk: Secondary | ICD-10-CM | POA: Diagnosis not present

## 2019-12-09 DIAGNOSIS — L814 Other melanin hyperpigmentation: Secondary | ICD-10-CM | POA: Diagnosis not present

## 2019-12-09 DIAGNOSIS — Z85828 Personal history of other malignant neoplasm of skin: Secondary | ICD-10-CM | POA: Diagnosis not present

## 2019-12-14 DIAGNOSIS — Z1159 Encounter for screening for other viral diseases: Secondary | ICD-10-CM | POA: Diagnosis not present

## 2019-12-15 DIAGNOSIS — C44519 Basal cell carcinoma of skin of other part of trunk: Secondary | ICD-10-CM | POA: Diagnosis not present

## 2019-12-15 DIAGNOSIS — Z85828 Personal history of other malignant neoplasm of skin: Secondary | ICD-10-CM | POA: Diagnosis not present

## 2019-12-19 DIAGNOSIS — Z800 Family history of malignant neoplasm of digestive organs: Secondary | ICD-10-CM | POA: Diagnosis not present

## 2019-12-23 ENCOUNTER — Ambulatory Visit: Payer: Medicare HMO | Attending: Family Medicine

## 2019-12-23 DIAGNOSIS — Z23 Encounter for immunization: Secondary | ICD-10-CM | POA: Insufficient documentation

## 2019-12-23 NOTE — Progress Notes (Signed)
   Covid-19 Vaccination Clinic  Name:  Grace Nelson    MRN: [id number] DOB: Dec 27, 1950  12/23/2019  [name] was observed post Covid-19 immunization for 15 minutes without incidence. She was provided with Vaccine Information Sheet and instruction to access the V-Safe system.   [name] was instructed to call 911 with any severe reactions post vaccine: Marland Kitchen Difficulty breathing  . Swelling of your face and throat  . A fast heartbeat  . A bad rash all over your body  . Dizziness and weakness    Immunizations Administered    Name Date Dose VIS Date Route   Pfizer COVID-19 Vaccine 12/23/2019  4:08 PM 0.3 mL 11/11/2019 Intramuscular   Manufacturer: [place]   Lot: [id number]   [place]: [id number]

## 2020-01-14 ENCOUNTER — Ambulatory Visit: Payer: Medicare HMO | Attending: Internal Medicine

## 2020-01-14 DIAGNOSIS — Z23 Encounter for immunization: Secondary | ICD-10-CM | POA: Insufficient documentation

## 2020-01-14 NOTE — Progress Notes (Signed)
   Covid-19 Vaccination Clinic  Name:  JOYCELYN ANNEAR    MRN: [id number] DOB: [birth year]  01/14/2020  [name] was observed post Covid-19 immunization for 15 minutes without incidence. She was provided with Vaccine Information Sheet and instruction to access the V-Safe system.   [name] was instructed to call 911 with any severe reactions post vaccine: Marland Kitchen Difficulty breathing  . Swelling of your face and throat  . A fast heartbeat  . A bad rash all over your body  . Dizziness and weakness    Immunizations Administered    Name Date Dose VIS Date Route   Pfizer COVID-19 Vaccine 01/14/2020 11:20 AM 0.3 mL 11/11/2019 Intramuscular   Manufacturer: [place]   Lot: [id number]   [place]: [id number]

## 2020-02-24 ENCOUNTER — Ambulatory Visit (INDEPENDENT_AMBULATORY_CARE_PROVIDER_SITE_OTHER)
Admission: RE | Admit: 2020-02-24 | Discharge: 2020-02-24 | Disposition: A | Discharge status: Home / Self Care | Payer: Medicare HMO | Source: Ambulatory Visit | Attending: Acute Care | Admitting: Acute Care

## 2020-02-24 ENCOUNTER — Other Ambulatory Visit: Payer: Self-pay

## 2020-02-24 DIAGNOSIS — R918 Other nonspecific abnormal finding of lung field: Secondary | ICD-10-CM

## 2020-02-24 DIAGNOSIS — Z87891 Personal history of nicotine dependence: Secondary | ICD-10-CM

## 2020-02-24 DIAGNOSIS — Z122 Encounter for screening for malignant neoplasm of respiratory organs: Secondary | ICD-10-CM

## 2020-02-24 DIAGNOSIS — J439 Emphysema, unspecified: Secondary | ICD-10-CM | POA: Diagnosis not present

## 2020-02-24 DIAGNOSIS — F17210 Nicotine dependence, cigarettes, uncomplicated: Secondary | ICD-10-CM

## 2020-02-24 NOTE — Progress Notes (Signed)
Please call patient and let them  know their  low dose Ct was read as a Lung RADS 2: nodules that are benign in appearance and behavior with a very low likelihood of becoming a clinically active cancer due to size or lack of growth. Recommendation per radiology is for a repeat LDCT in 12 months. .Please let them  know we will order and schedule their  annual screening scan for 01/2021. Please let them  know there was notation of CAD on their  scan.  Please remind the patient  that this is a non-gated exam therefore degree or severity of disease  cannot be determined. Please have them  follow up with their PCP regarding potential risk factor modification, dietary therapy or pharmacologic therapy if clinically indicated. Pt.  is not  currently on statin therapy. Please place order for annual  screening scan for   and fax results to PCP. Thanks so much.

## 2020-02-27 ENCOUNTER — Other Ambulatory Visit: Payer: Self-pay | Admitting: *Deleted

## 2020-02-27 DIAGNOSIS — Z87891 Personal history of nicotine dependence: Secondary | ICD-10-CM

## 2020-02-27 DIAGNOSIS — F17210 Nicotine dependence, cigarettes, uncomplicated: Secondary | ICD-10-CM

## 2020-08-28 DIAGNOSIS — Z1231 Encounter for screening mammogram for malignant neoplasm of breast: Secondary | ICD-10-CM | POA: Diagnosis not present

## 2020-09-25 DIAGNOSIS — I2510 Atherosclerotic heart disease of native coronary artery without angina pectoris: Secondary | ICD-10-CM | POA: Diagnosis not present

## 2020-09-25 DIAGNOSIS — Z23 Encounter for immunization: Secondary | ICD-10-CM | POA: Diagnosis not present

## 2020-09-25 DIAGNOSIS — M859 Disorder of bone density and structure, unspecified: Secondary | ICD-10-CM | POA: Diagnosis not present

## 2020-09-25 DIAGNOSIS — Z79899 Other long term (current) drug therapy: Secondary | ICD-10-CM | POA: Diagnosis not present

## 2020-09-25 DIAGNOSIS — F17210 Nicotine dependence, cigarettes, uncomplicated: Secondary | ICD-10-CM | POA: Diagnosis not present

## 2020-09-25 DIAGNOSIS — E669 Obesity, unspecified: Secondary | ICD-10-CM | POA: Diagnosis not present

## 2020-09-25 DIAGNOSIS — I700 Atherosclerosis of aorta: Secondary | ICD-10-CM | POA: Diagnosis not present

## 2020-09-25 DIAGNOSIS — Z6831 Body mass index (BMI) 31.0-31.9, adult: Secondary | ICD-10-CM | POA: Diagnosis not present

## 2020-09-25 DIAGNOSIS — Z0000 Encounter for general adult medical examination without abnormal findings: Secondary | ICD-10-CM | POA: Diagnosis not present

## 2020-09-29 ENCOUNTER — Ambulatory Visit: Payer: Medicare HMO | Attending: Internal Medicine

## 2020-09-29 DIAGNOSIS — Z23 Encounter for immunization: Secondary | ICD-10-CM

## 2020-09-29 NOTE — Progress Notes (Signed)
   Covid-19 Vaccination Clinic  Name:  Grace Nelson    MRN: [id number] DOB: Mar 20, 1951  09/29/2020  [name] was observed post Covid-19 immunization for 15 minutes without incident. She was provided with Vaccine Information Sheet and instruction to access the V-Safe system.   [name] was instructed to call 911 with any severe reactions post vaccine: Marland Kitchen Difficulty breathing  . Swelling of face and throat  . A fast heartbeat  . A bad rash all over body  . Dizziness and weakness

## 2020-12-11 DIAGNOSIS — Z85828 Personal history of other malignant neoplasm of skin: Secondary | ICD-10-CM | POA: Diagnosis not present

## 2020-12-11 DIAGNOSIS — D234 Other benign neoplasm of skin of scalp and neck: Secondary | ICD-10-CM | POA: Diagnosis not present

## 2020-12-11 DIAGNOSIS — D2272 Melanocytic nevi of left lower limb, including hip: Secondary | ICD-10-CM | POA: Diagnosis not present

## 2020-12-11 DIAGNOSIS — D225 Melanocytic nevi of trunk: Secondary | ICD-10-CM | POA: Diagnosis not present

## 2020-12-11 DIAGNOSIS — D2262 Melanocytic nevi of left upper limb, including shoulder: Secondary | ICD-10-CM | POA: Diagnosis not present

## 2020-12-11 DIAGNOSIS — L570 Actinic keratosis: Secondary | ICD-10-CM | POA: Diagnosis not present

## 2020-12-11 DIAGNOSIS — D2261 Melanocytic nevi of right upper limb, including shoulder: Secondary | ICD-10-CM | POA: Diagnosis not present

## 2020-12-11 DIAGNOSIS — L814 Other melanin hyperpigmentation: Secondary | ICD-10-CM | POA: Diagnosis not present

## 2020-12-11 DIAGNOSIS — L821 Other seborrheic keratosis: Secondary | ICD-10-CM | POA: Diagnosis not present

## 2020-12-27 DIAGNOSIS — H524 Presbyopia: Secondary | ICD-10-CM | POA: Diagnosis not present

## 2020-12-27 DIAGNOSIS — E7800 Pure hypercholesterolemia, unspecified: Secondary | ICD-10-CM | POA: Diagnosis not present

## 2021-03-25 ENCOUNTER — Ambulatory Visit (INDEPENDENT_AMBULATORY_CARE_PROVIDER_SITE_OTHER)
Admission: RE | Admit: 2021-03-25 | Discharge: 2021-03-25 | Disposition: A | Discharge status: Home / Self Care | Payer: Medicare HMO | Source: Ambulatory Visit | Attending: Family Medicine | Admitting: Family Medicine

## 2021-03-25 ENCOUNTER — Other Ambulatory Visit: Payer: Self-pay

## 2021-03-25 DIAGNOSIS — Z87891 Personal history of nicotine dependence: Secondary | ICD-10-CM | POA: Diagnosis not present

## 2021-03-25 DIAGNOSIS — F17210 Nicotine dependence, cigarettes, uncomplicated: Secondary | ICD-10-CM | POA: Diagnosis not present

## 2021-04-04 ENCOUNTER — Other Ambulatory Visit: Payer: Self-pay | Admitting: *Deleted

## 2021-04-04 ENCOUNTER — Encounter: Payer: Self-pay | Admitting: *Deleted

## 2021-04-04 DIAGNOSIS — F17210 Nicotine dependence, cigarettes, uncomplicated: Secondary | ICD-10-CM

## 2021-04-04 DIAGNOSIS — Z87891 Personal history of nicotine dependence: Secondary | ICD-10-CM

## 2021-04-04 NOTE — Progress Notes (Signed)
Please make sure the patient knows she does have a small hiatal hernia. Thanks so much

## 2021-04-04 NOTE — Progress Notes (Signed)

## 2021-09-03 DIAGNOSIS — Z780 Asymptomatic menopausal state: Secondary | ICD-10-CM | POA: Diagnosis not present

## 2021-09-03 DIAGNOSIS — M85851 Other specified disorders of bone density and structure, right thigh: Secondary | ICD-10-CM | POA: Diagnosis not present

## 2021-09-03 DIAGNOSIS — M85852 Other specified disorders of bone density and structure, left thigh: Secondary | ICD-10-CM | POA: Diagnosis not present

## 2021-09-03 DIAGNOSIS — Z1231 Encounter for screening mammogram for malignant neoplasm of breast: Secondary | ICD-10-CM | POA: Diagnosis not present

## 2021-10-04 DIAGNOSIS — Z0000 Encounter for general adult medical examination without abnormal findings: Secondary | ICD-10-CM | POA: Diagnosis not present

## 2021-10-04 DIAGNOSIS — Z79899 Other long term (current) drug therapy: Secondary | ICD-10-CM | POA: Diagnosis not present

## 2021-10-04 DIAGNOSIS — I2510 Atherosclerotic heart disease of native coronary artery without angina pectoris: Secondary | ICD-10-CM | POA: Diagnosis not present

## 2021-10-04 DIAGNOSIS — G5701 Lesion of sciatic nerve, right lower limb: Secondary | ICD-10-CM | POA: Diagnosis not present

## 2021-10-04 DIAGNOSIS — M8580 Other specified disorders of bone density and structure, unspecified site: Secondary | ICD-10-CM | POA: Diagnosis not present

## 2021-10-04 DIAGNOSIS — E669 Obesity, unspecified: Secondary | ICD-10-CM | POA: Diagnosis not present

## 2021-10-04 DIAGNOSIS — F17210 Nicotine dependence, cigarettes, uncomplicated: Secondary | ICD-10-CM | POA: Diagnosis not present

## 2021-10-04 DIAGNOSIS — I700 Atherosclerosis of aorta: Secondary | ICD-10-CM | POA: Diagnosis not present

## 2021-10-04 DIAGNOSIS — Z23 Encounter for immunization: Secondary | ICD-10-CM | POA: Diagnosis not present

## 2021-10-04 DIAGNOSIS — M85852 Other specified disorders of bone density and structure, left thigh: Secondary | ICD-10-CM | POA: Diagnosis not present

## 2021-12-10 DIAGNOSIS — M9901 Segmental and somatic dysfunction of cervical region: Secondary | ICD-10-CM | POA: Diagnosis not present

## 2021-12-10 DIAGNOSIS — M5441 Lumbago with sciatica, right side: Secondary | ICD-10-CM | POA: Diagnosis not present

## 2021-12-10 DIAGNOSIS — M9905 Segmental and somatic dysfunction of pelvic region: Secondary | ICD-10-CM | POA: Diagnosis not present

## 2021-12-10 DIAGNOSIS — M9903 Segmental and somatic dysfunction of lumbar region: Secondary | ICD-10-CM | POA: Diagnosis not present

## 2021-12-11 DIAGNOSIS — M9903 Segmental and somatic dysfunction of lumbar region: Secondary | ICD-10-CM | POA: Diagnosis not present

## 2021-12-11 DIAGNOSIS — M9901 Segmental and somatic dysfunction of cervical region: Secondary | ICD-10-CM | POA: Diagnosis not present

## 2021-12-11 DIAGNOSIS — M9905 Segmental and somatic dysfunction of pelvic region: Secondary | ICD-10-CM | POA: Diagnosis not present

## 2021-12-11 DIAGNOSIS — M5441 Lumbago with sciatica, right side: Secondary | ICD-10-CM | POA: Diagnosis not present

## 2021-12-12 DIAGNOSIS — M9903 Segmental and somatic dysfunction of lumbar region: Secondary | ICD-10-CM | POA: Diagnosis not present

## 2021-12-12 DIAGNOSIS — M9901 Segmental and somatic dysfunction of cervical region: Secondary | ICD-10-CM | POA: Diagnosis not present

## 2021-12-12 DIAGNOSIS — Z85828 Personal history of other malignant neoplasm of skin: Secondary | ICD-10-CM | POA: Diagnosis not present

## 2021-12-12 DIAGNOSIS — D235 Other benign neoplasm of skin of trunk: Secondary | ICD-10-CM | POA: Diagnosis not present

## 2021-12-12 DIAGNOSIS — M9905 Segmental and somatic dysfunction of pelvic region: Secondary | ICD-10-CM | POA: Diagnosis not present

## 2021-12-12 DIAGNOSIS — L821 Other seborrheic keratosis: Secondary | ICD-10-CM | POA: Diagnosis not present

## 2021-12-12 DIAGNOSIS — I788 Other diseases of capillaries: Secondary | ICD-10-CM | POA: Diagnosis not present

## 2021-12-12 DIAGNOSIS — M5441 Lumbago with sciatica, right side: Secondary | ICD-10-CM | POA: Diagnosis not present

## 2021-12-12 DIAGNOSIS — L853 Xerosis cutis: Secondary | ICD-10-CM | POA: Diagnosis not present

## 2021-12-12 DIAGNOSIS — D1801 Hemangioma of skin and subcutaneous tissue: Secondary | ICD-10-CM | POA: Diagnosis not present

## 2021-12-12 DIAGNOSIS — L814 Other melanin hyperpigmentation: Secondary | ICD-10-CM | POA: Diagnosis not present

## 2021-12-12 DIAGNOSIS — C44619 Basal cell carcinoma of skin of left upper limb, including shoulder: Secondary | ICD-10-CM | POA: Diagnosis not present

## 2021-12-16 DIAGNOSIS — M9901 Segmental and somatic dysfunction of cervical region: Secondary | ICD-10-CM | POA: Diagnosis not present

## 2021-12-16 DIAGNOSIS — M9903 Segmental and somatic dysfunction of lumbar region: Secondary | ICD-10-CM | POA: Diagnosis not present

## 2021-12-16 DIAGNOSIS — M5441 Lumbago with sciatica, right side: Secondary | ICD-10-CM | POA: Diagnosis not present

## 2021-12-16 DIAGNOSIS — M9905 Segmental and somatic dysfunction of pelvic region: Secondary | ICD-10-CM | POA: Diagnosis not present

## 2021-12-17 DIAGNOSIS — M9903 Segmental and somatic dysfunction of lumbar region: Secondary | ICD-10-CM | POA: Diagnosis not present

## 2021-12-17 DIAGNOSIS — M9905 Segmental and somatic dysfunction of pelvic region: Secondary | ICD-10-CM | POA: Diagnosis not present

## 2021-12-17 DIAGNOSIS — M5441 Lumbago with sciatica, right side: Secondary | ICD-10-CM | POA: Diagnosis not present

## 2021-12-17 DIAGNOSIS — M9901 Segmental and somatic dysfunction of cervical region: Secondary | ICD-10-CM | POA: Diagnosis not present

## 2021-12-19 DIAGNOSIS — M9901 Segmental and somatic dysfunction of cervical region: Secondary | ICD-10-CM | POA: Diagnosis not present

## 2021-12-19 DIAGNOSIS — M5441 Lumbago with sciatica, right side: Secondary | ICD-10-CM | POA: Diagnosis not present

## 2021-12-19 DIAGNOSIS — M9903 Segmental and somatic dysfunction of lumbar region: Secondary | ICD-10-CM | POA: Diagnosis not present

## 2021-12-19 DIAGNOSIS — M9905 Segmental and somatic dysfunction of pelvic region: Secondary | ICD-10-CM | POA: Diagnosis not present

## 2021-12-23 DIAGNOSIS — M5441 Lumbago with sciatica, right side: Secondary | ICD-10-CM | POA: Diagnosis not present

## 2021-12-23 DIAGNOSIS — M9901 Segmental and somatic dysfunction of cervical region: Secondary | ICD-10-CM | POA: Diagnosis not present

## 2021-12-23 DIAGNOSIS — M9905 Segmental and somatic dysfunction of pelvic region: Secondary | ICD-10-CM | POA: Diagnosis not present

## 2021-12-23 DIAGNOSIS — M9903 Segmental and somatic dysfunction of lumbar region: Secondary | ICD-10-CM | POA: Diagnosis not present

## 2021-12-24 DIAGNOSIS — M9905 Segmental and somatic dysfunction of pelvic region: Secondary | ICD-10-CM | POA: Diagnosis not present

## 2021-12-24 DIAGNOSIS — M9903 Segmental and somatic dysfunction of lumbar region: Secondary | ICD-10-CM | POA: Diagnosis not present

## 2021-12-24 DIAGNOSIS — M9901 Segmental and somatic dysfunction of cervical region: Secondary | ICD-10-CM | POA: Diagnosis not present

## 2021-12-24 DIAGNOSIS — M5441 Lumbago with sciatica, right side: Secondary | ICD-10-CM | POA: Diagnosis not present

## 2021-12-26 DIAGNOSIS — M5441 Lumbago with sciatica, right side: Secondary | ICD-10-CM | POA: Diagnosis not present

## 2021-12-26 DIAGNOSIS — M9903 Segmental and somatic dysfunction of lumbar region: Secondary | ICD-10-CM | POA: Diagnosis not present

## 2021-12-26 DIAGNOSIS — M9905 Segmental and somatic dysfunction of pelvic region: Secondary | ICD-10-CM | POA: Diagnosis not present

## 2021-12-26 DIAGNOSIS — M9901 Segmental and somatic dysfunction of cervical region: Secondary | ICD-10-CM | POA: Diagnosis not present

## 2021-12-31 DIAGNOSIS — M5441 Lumbago with sciatica, right side: Secondary | ICD-10-CM | POA: Diagnosis not present

## 2021-12-31 DIAGNOSIS — M9905 Segmental and somatic dysfunction of pelvic region: Secondary | ICD-10-CM | POA: Diagnosis not present

## 2021-12-31 DIAGNOSIS — M9901 Segmental and somatic dysfunction of cervical region: Secondary | ICD-10-CM | POA: Diagnosis not present

## 2021-12-31 DIAGNOSIS — M9903 Segmental and somatic dysfunction of lumbar region: Secondary | ICD-10-CM | POA: Diagnosis not present

## 2022-01-02 DIAGNOSIS — M9905 Segmental and somatic dysfunction of pelvic region: Secondary | ICD-10-CM | POA: Diagnosis not present

## 2022-01-02 DIAGNOSIS — M5441 Lumbago with sciatica, right side: Secondary | ICD-10-CM | POA: Diagnosis not present

## 2022-01-02 DIAGNOSIS — M9903 Segmental and somatic dysfunction of lumbar region: Secondary | ICD-10-CM | POA: Diagnosis not present

## 2022-01-02 DIAGNOSIS — M9901 Segmental and somatic dysfunction of cervical region: Secondary | ICD-10-CM | POA: Diagnosis not present

## 2022-01-07 DIAGNOSIS — M9903 Segmental and somatic dysfunction of lumbar region: Secondary | ICD-10-CM | POA: Diagnosis not present

## 2022-01-07 DIAGNOSIS — M9905 Segmental and somatic dysfunction of pelvic region: Secondary | ICD-10-CM | POA: Diagnosis not present

## 2022-01-07 DIAGNOSIS — M5441 Lumbago with sciatica, right side: Secondary | ICD-10-CM | POA: Diagnosis not present

## 2022-01-07 DIAGNOSIS — M9901 Segmental and somatic dysfunction of cervical region: Secondary | ICD-10-CM | POA: Diagnosis not present

## 2022-01-10 DIAGNOSIS — M5441 Lumbago with sciatica, right side: Secondary | ICD-10-CM | POA: Diagnosis not present

## 2022-01-10 DIAGNOSIS — M9905 Segmental and somatic dysfunction of pelvic region: Secondary | ICD-10-CM | POA: Diagnosis not present

## 2022-01-10 DIAGNOSIS — M9903 Segmental and somatic dysfunction of lumbar region: Secondary | ICD-10-CM | POA: Diagnosis not present

## 2022-01-10 DIAGNOSIS — M9901 Segmental and somatic dysfunction of cervical region: Secondary | ICD-10-CM | POA: Diagnosis not present

## 2022-01-14 DIAGNOSIS — M9901 Segmental and somatic dysfunction of cervical region: Secondary | ICD-10-CM | POA: Diagnosis not present

## 2022-01-14 DIAGNOSIS — M5441 Lumbago with sciatica, right side: Secondary | ICD-10-CM | POA: Diagnosis not present

## 2022-01-14 DIAGNOSIS — M9905 Segmental and somatic dysfunction of pelvic region: Secondary | ICD-10-CM | POA: Diagnosis not present

## 2022-01-14 DIAGNOSIS — M9903 Segmental and somatic dysfunction of lumbar region: Secondary | ICD-10-CM | POA: Diagnosis not present

## 2022-01-16 DIAGNOSIS — M5441 Lumbago with sciatica, right side: Secondary | ICD-10-CM | POA: Diagnosis not present

## 2022-01-16 DIAGNOSIS — M9901 Segmental and somatic dysfunction of cervical region: Secondary | ICD-10-CM | POA: Diagnosis not present

## 2022-01-16 DIAGNOSIS — M9905 Segmental and somatic dysfunction of pelvic region: Secondary | ICD-10-CM | POA: Diagnosis not present

## 2022-01-16 DIAGNOSIS — M9903 Segmental and somatic dysfunction of lumbar region: Secondary | ICD-10-CM | POA: Diagnosis not present

## 2022-01-20 DIAGNOSIS — M9905 Segmental and somatic dysfunction of pelvic region: Secondary | ICD-10-CM | POA: Diagnosis not present

## 2022-01-20 DIAGNOSIS — M5441 Lumbago with sciatica, right side: Secondary | ICD-10-CM | POA: Diagnosis not present

## 2022-01-20 DIAGNOSIS — M9901 Segmental and somatic dysfunction of cervical region: Secondary | ICD-10-CM | POA: Diagnosis not present

## 2022-01-20 DIAGNOSIS — M9903 Segmental and somatic dysfunction of lumbar region: Secondary | ICD-10-CM | POA: Diagnosis not present

## 2022-01-24 DIAGNOSIS — M9901 Segmental and somatic dysfunction of cervical region: Secondary | ICD-10-CM | POA: Diagnosis not present

## 2022-01-24 DIAGNOSIS — M9905 Segmental and somatic dysfunction of pelvic region: Secondary | ICD-10-CM | POA: Diagnosis not present

## 2022-01-24 DIAGNOSIS — M5441 Lumbago with sciatica, right side: Secondary | ICD-10-CM | POA: Diagnosis not present

## 2022-01-24 DIAGNOSIS — M9903 Segmental and somatic dysfunction of lumbar region: Secondary | ICD-10-CM | POA: Diagnosis not present

## 2022-01-28 DIAGNOSIS — M9905 Segmental and somatic dysfunction of pelvic region: Secondary | ICD-10-CM | POA: Diagnosis not present

## 2022-01-28 DIAGNOSIS — M5441 Lumbago with sciatica, right side: Secondary | ICD-10-CM | POA: Diagnosis not present

## 2022-01-28 DIAGNOSIS — M9903 Segmental and somatic dysfunction of lumbar region: Secondary | ICD-10-CM | POA: Diagnosis not present

## 2022-01-28 DIAGNOSIS — M9901 Segmental and somatic dysfunction of cervical region: Secondary | ICD-10-CM | POA: Diagnosis not present

## 2022-01-31 DIAGNOSIS — M9903 Segmental and somatic dysfunction of lumbar region: Secondary | ICD-10-CM | POA: Diagnosis not present

## 2022-01-31 DIAGNOSIS — M5441 Lumbago with sciatica, right side: Secondary | ICD-10-CM | POA: Diagnosis not present

## 2022-01-31 DIAGNOSIS — M9905 Segmental and somatic dysfunction of pelvic region: Secondary | ICD-10-CM | POA: Diagnosis not present

## 2022-01-31 DIAGNOSIS — M9901 Segmental and somatic dysfunction of cervical region: Secondary | ICD-10-CM | POA: Diagnosis not present

## 2022-02-04 DIAGNOSIS — M9901 Segmental and somatic dysfunction of cervical region: Secondary | ICD-10-CM | POA: Diagnosis not present

## 2022-02-04 DIAGNOSIS — M5441 Lumbago with sciatica, right side: Secondary | ICD-10-CM | POA: Diagnosis not present

## 2022-02-04 DIAGNOSIS — M9903 Segmental and somatic dysfunction of lumbar region: Secondary | ICD-10-CM | POA: Diagnosis not present

## 2022-02-04 DIAGNOSIS — M9905 Segmental and somatic dysfunction of pelvic region: Secondary | ICD-10-CM | POA: Diagnosis not present

## 2022-02-07 DIAGNOSIS — M9901 Segmental and somatic dysfunction of cervical region: Secondary | ICD-10-CM | POA: Diagnosis not present

## 2022-02-07 DIAGNOSIS — M9905 Segmental and somatic dysfunction of pelvic region: Secondary | ICD-10-CM | POA: Diagnosis not present

## 2022-02-07 DIAGNOSIS — M5441 Lumbago with sciatica, right side: Secondary | ICD-10-CM | POA: Diagnosis not present

## 2022-02-07 DIAGNOSIS — M9903 Segmental and somatic dysfunction of lumbar region: Secondary | ICD-10-CM | POA: Diagnosis not present

## 2022-02-11 DIAGNOSIS — M5441 Lumbago with sciatica, right side: Secondary | ICD-10-CM | POA: Diagnosis not present

## 2022-02-11 DIAGNOSIS — M9905 Segmental and somatic dysfunction of pelvic region: Secondary | ICD-10-CM | POA: Diagnosis not present

## 2022-02-11 DIAGNOSIS — M9903 Segmental and somatic dysfunction of lumbar region: Secondary | ICD-10-CM | POA: Diagnosis not present

## 2022-02-11 DIAGNOSIS — M9901 Segmental and somatic dysfunction of cervical region: Secondary | ICD-10-CM | POA: Diagnosis not present

## 2022-02-14 DIAGNOSIS — M5441 Lumbago with sciatica, right side: Secondary | ICD-10-CM | POA: Diagnosis not present

## 2022-02-14 DIAGNOSIS — M9901 Segmental and somatic dysfunction of cervical region: Secondary | ICD-10-CM | POA: Diagnosis not present

## 2022-02-14 DIAGNOSIS — M9905 Segmental and somatic dysfunction of pelvic region: Secondary | ICD-10-CM | POA: Diagnosis not present

## 2022-02-14 DIAGNOSIS — M9903 Segmental and somatic dysfunction of lumbar region: Secondary | ICD-10-CM | POA: Diagnosis not present

## 2022-02-18 DIAGNOSIS — M9905 Segmental and somatic dysfunction of pelvic region: Secondary | ICD-10-CM | POA: Diagnosis not present

## 2022-02-18 DIAGNOSIS — M5441 Lumbago with sciatica, right side: Secondary | ICD-10-CM | POA: Diagnosis not present

## 2022-02-18 DIAGNOSIS — M9901 Segmental and somatic dysfunction of cervical region: Secondary | ICD-10-CM | POA: Diagnosis not present

## 2022-02-18 DIAGNOSIS — M9903 Segmental and somatic dysfunction of lumbar region: Secondary | ICD-10-CM | POA: Diagnosis not present

## 2022-02-20 DIAGNOSIS — M5441 Lumbago with sciatica, right side: Secondary | ICD-10-CM | POA: Diagnosis not present

## 2022-02-20 DIAGNOSIS — M9901 Segmental and somatic dysfunction of cervical region: Secondary | ICD-10-CM | POA: Diagnosis not present

## 2022-02-20 DIAGNOSIS — M9903 Segmental and somatic dysfunction of lumbar region: Secondary | ICD-10-CM | POA: Diagnosis not present

## 2022-02-20 DIAGNOSIS — M9905 Segmental and somatic dysfunction of pelvic region: Secondary | ICD-10-CM | POA: Diagnosis not present

## 2022-02-25 DIAGNOSIS — M5441 Lumbago with sciatica, right side: Secondary | ICD-10-CM | POA: Diagnosis not present

## 2022-02-25 DIAGNOSIS — M9903 Segmental and somatic dysfunction of lumbar region: Secondary | ICD-10-CM | POA: Diagnosis not present

## 2022-02-25 DIAGNOSIS — M9901 Segmental and somatic dysfunction of cervical region: Secondary | ICD-10-CM | POA: Diagnosis not present

## 2022-02-25 DIAGNOSIS — M9905 Segmental and somatic dysfunction of pelvic region: Secondary | ICD-10-CM | POA: Diagnosis not present

## 2022-02-27 DIAGNOSIS — M9905 Segmental and somatic dysfunction of pelvic region: Secondary | ICD-10-CM | POA: Diagnosis not present

## 2022-02-27 DIAGNOSIS — M9901 Segmental and somatic dysfunction of cervical region: Secondary | ICD-10-CM | POA: Diagnosis not present

## 2022-02-27 DIAGNOSIS — M5441 Lumbago with sciatica, right side: Secondary | ICD-10-CM | POA: Diagnosis not present

## 2022-02-27 DIAGNOSIS — M9903 Segmental and somatic dysfunction of lumbar region: Secondary | ICD-10-CM | POA: Diagnosis not present

## 2022-03-04 DIAGNOSIS — M9903 Segmental and somatic dysfunction of lumbar region: Secondary | ICD-10-CM | POA: Diagnosis not present

## 2022-03-04 DIAGNOSIS — M9901 Segmental and somatic dysfunction of cervical region: Secondary | ICD-10-CM | POA: Diagnosis not present

## 2022-03-04 DIAGNOSIS — M5441 Lumbago with sciatica, right side: Secondary | ICD-10-CM | POA: Diagnosis not present

## 2022-03-04 DIAGNOSIS — M9905 Segmental and somatic dysfunction of pelvic region: Secondary | ICD-10-CM | POA: Diagnosis not present

## 2022-03-11 DIAGNOSIS — M9903 Segmental and somatic dysfunction of lumbar region: Secondary | ICD-10-CM | POA: Diagnosis not present

## 2022-03-11 DIAGNOSIS — M9901 Segmental and somatic dysfunction of cervical region: Secondary | ICD-10-CM | POA: Diagnosis not present

## 2022-03-11 DIAGNOSIS — M5441 Lumbago with sciatica, right side: Secondary | ICD-10-CM | POA: Diagnosis not present

## 2022-03-11 DIAGNOSIS — M9905 Segmental and somatic dysfunction of pelvic region: Secondary | ICD-10-CM | POA: Diagnosis not present

## 2022-03-18 DIAGNOSIS — M9903 Segmental and somatic dysfunction of lumbar region: Secondary | ICD-10-CM | POA: Diagnosis not present

## 2022-03-18 DIAGNOSIS — M9905 Segmental and somatic dysfunction of pelvic region: Secondary | ICD-10-CM | POA: Diagnosis not present

## 2022-03-18 DIAGNOSIS — M9901 Segmental and somatic dysfunction of cervical region: Secondary | ICD-10-CM | POA: Diagnosis not present

## 2022-03-18 DIAGNOSIS — M5441 Lumbago with sciatica, right side: Secondary | ICD-10-CM | POA: Diagnosis not present

## 2022-03-25 ENCOUNTER — Ambulatory Visit (INDEPENDENT_AMBULATORY_CARE_PROVIDER_SITE_OTHER)
Admission: RE | Admit: 2022-03-25 | Discharge: 2022-03-25 | Disposition: A | Discharge status: Home / Self Care | Payer: Medicare HMO | Source: Ambulatory Visit | Attending: Acute Care | Admitting: Acute Care

## 2022-03-25 DIAGNOSIS — F17200 Nicotine dependence, unspecified, uncomplicated: Secondary | ICD-10-CM

## 2022-03-25 DIAGNOSIS — F17210 Nicotine dependence, cigarettes, uncomplicated: Secondary | ICD-10-CM

## 2022-03-25 DIAGNOSIS — J439 Emphysema, unspecified: Secondary | ICD-10-CM | POA: Diagnosis not present

## 2022-03-25 DIAGNOSIS — Z87891 Personal history of nicotine dependence: Secondary | ICD-10-CM | POA: Diagnosis not present

## 2022-03-25 DIAGNOSIS — I2510 Atherosclerotic heart disease of native coronary artery without angina pectoris: Secondary | ICD-10-CM

## 2022-03-25 DIAGNOSIS — I700 Atherosclerosis of aorta: Secondary | ICD-10-CM | POA: Diagnosis not present

## 2022-03-25 DIAGNOSIS — K449 Diaphragmatic hernia without obstruction or gangrene: Secondary | ICD-10-CM | POA: Diagnosis not present

## 2022-03-26 ENCOUNTER — Telehealth: Payer: Self-pay | Admitting: Acute Care

## 2022-03-26 ENCOUNTER — Other Ambulatory Visit: Payer: Self-pay

## 2022-03-26 DIAGNOSIS — Z122 Encounter for screening for malignant neoplasm of respiratory organs: Secondary | ICD-10-CM

## 2022-03-26 DIAGNOSIS — F17210 Nicotine dependence, cigarettes, uncomplicated: Secondary | ICD-10-CM

## 2022-03-26 DIAGNOSIS — Z87891 Personal history of nicotine dependence: Secondary | ICD-10-CM

## 2022-03-26 NOTE — Telephone Encounter (Signed)
Spoke with patient by phone to review results of LDCT.  Results show similar as 2022 CT scan with atherosclerosis, emphysema and small hiatal hernia.  Patient is on statin medication, has some shortness of breath as she is getting older. Wasn't aware of hiatal hernia but no complaints related to reflux, etc. Advised if shortness of breath interferes with daily activities she should discuss with PCP for further recommendations.  Patient acknowledged understanding and had no further questions. ?

## 2022-08-25 ENCOUNTER — Other Ambulatory Visit: Payer: Self-pay

## 2022-08-25 ENCOUNTER — Inpatient Hospital Stay (HOSPITAL_COMMUNITY)
Admission: EM | Admit: 2022-08-25 | Discharge: 2022-08-29 | DRG: 190 | Disposition: A | Discharge status: Home Health | Payer: Medicare HMO | Attending: Internal Medicine | Admitting: Internal Medicine

## 2022-08-25 ENCOUNTER — Emergency Department (HOSPITAL_COMMUNITY): Payer: Medicare HMO

## 2022-08-25 ENCOUNTER — Encounter (HOSPITAL_COMMUNITY): Payer: Self-pay | Admitting: Oncology

## 2022-08-25 DIAGNOSIS — Z825 Family history of asthma and other chronic lower respiratory diseases: Secondary | ICD-10-CM | POA: Diagnosis not present

## 2022-08-25 DIAGNOSIS — J9601 Acute respiratory failure with hypoxia: Secondary | ICD-10-CM | POA: Diagnosis present

## 2022-08-25 DIAGNOSIS — J069 Acute upper respiratory infection, unspecified: Secondary | ICD-10-CM | POA: Diagnosis not present

## 2022-08-25 DIAGNOSIS — Z79899 Other long term (current) drug therapy: Secondary | ICD-10-CM

## 2022-08-25 DIAGNOSIS — R0602 Shortness of breath: Secondary | ICD-10-CM | POA: Diagnosis not present

## 2022-08-25 DIAGNOSIS — D72829 Elevated white blood cell count, unspecified: Secondary | ICD-10-CM | POA: Diagnosis present

## 2022-08-25 DIAGNOSIS — J309 Allergic rhinitis, unspecified: Secondary | ICD-10-CM | POA: Diagnosis not present

## 2022-08-25 DIAGNOSIS — J441 Chronic obstructive pulmonary disease with (acute) exacerbation: Principal | ICD-10-CM | POA: Diagnosis present

## 2022-08-25 DIAGNOSIS — J9811 Atelectasis: Secondary | ICD-10-CM | POA: Diagnosis present

## 2022-08-25 DIAGNOSIS — T380X5A Adverse effect of glucocorticoids and synthetic analogues, initial encounter: Secondary | ICD-10-CM | POA: Diagnosis present

## 2022-08-25 DIAGNOSIS — E876 Hypokalemia: Secondary | ICD-10-CM | POA: Diagnosis present

## 2022-08-25 DIAGNOSIS — Z20822 Contact with and (suspected) exposure to covid-19: Secondary | ICD-10-CM | POA: Diagnosis not present

## 2022-08-25 DIAGNOSIS — R6510 Systemic inflammatory response syndrome (SIRS) of non-infectious origin without acute organ dysfunction: Secondary | ICD-10-CM | POA: Diagnosis not present

## 2022-08-25 DIAGNOSIS — R531 Weakness: Secondary | ICD-10-CM

## 2022-08-25 DIAGNOSIS — J439 Emphysema, unspecified: Secondary | ICD-10-CM | POA: Diagnosis not present

## 2022-08-25 DIAGNOSIS — R000 Tachycardia, unspecified: Secondary | ICD-10-CM | POA: Diagnosis not present

## 2022-08-25 DIAGNOSIS — Z6831 Body mass index (BMI) 31.0-31.9, adult: Secondary | ICD-10-CM | POA: Diagnosis not present

## 2022-08-25 DIAGNOSIS — Z886 Allergy status to analgesic agent status: Secondary | ICD-10-CM | POA: Diagnosis not present

## 2022-08-25 DIAGNOSIS — R0902 Hypoxemia: Secondary | ICD-10-CM | POA: Diagnosis not present

## 2022-08-25 DIAGNOSIS — Z882 Allergy status to sulfonamides status: Secondary | ICD-10-CM | POA: Diagnosis not present

## 2022-08-25 DIAGNOSIS — K449 Diaphragmatic hernia without obstruction or gangrene: Secondary | ICD-10-CM | POA: Diagnosis not present

## 2022-08-25 DIAGNOSIS — Z720 Tobacco use: Secondary | ICD-10-CM | POA: Diagnosis present

## 2022-08-25 DIAGNOSIS — R0789 Other chest pain: Secondary | ICD-10-CM | POA: Diagnosis not present

## 2022-08-25 DIAGNOSIS — E785 Hyperlipidemia, unspecified: Secondary | ICD-10-CM | POA: Diagnosis present

## 2022-08-25 DIAGNOSIS — Z91048 Other nonmedicinal substance allergy status: Secondary | ICD-10-CM | POA: Diagnosis not present

## 2022-08-25 DIAGNOSIS — R059 Cough, unspecified: Secondary | ICD-10-CM | POA: Diagnosis not present

## 2022-08-25 DIAGNOSIS — F17210 Nicotine dependence, cigarettes, uncomplicated: Secondary | ICD-10-CM | POA: Diagnosis not present

## 2022-08-25 DIAGNOSIS — J449 Chronic obstructive pulmonary disease, unspecified: Secondary | ICD-10-CM | POA: Diagnosis not present

## 2022-08-25 DIAGNOSIS — F32A Depression, unspecified: Secondary | ICD-10-CM | POA: Diagnosis not present

## 2022-08-25 DIAGNOSIS — R062 Wheezing: Secondary | ICD-10-CM | POA: Diagnosis not present

## 2022-08-25 DIAGNOSIS — R079 Chest pain, unspecified: Secondary | ICD-10-CM | POA: Diagnosis not present

## 2022-08-25 LAB — CBC
HCT: 45.7 % (ref 36.0–46.0)
Hemoglobin: 14.4 g/dL (ref 12.0–15.0)
MCH: 29.4 pg (ref 26.0–34.0)
MCHC: 31.5 g/dL (ref 30.0–36.0)
MCV: 93.3 fL (ref 80.0–100.0)
Platelets: 237 10*3/uL (ref 150–400)
RBC: 4.9 MIL/uL (ref 3.87–5.11)
RDW: 13.9 % (ref 11.5–15.5)
WBC: 12.6 10*3/uL — ABNORMAL HIGH (ref 4.0–10.5)
nRBC: 0 % (ref 0.0–0.2)

## 2022-08-25 LAB — COMPREHENSIVE METABOLIC PANEL
ALT: 9 U/L (ref 0–44)
AST: 15 U/L (ref 15–41)
Albumin: 3 g/dL — ABNORMAL LOW (ref 3.5–5.0)
Alkaline Phosphatase: 42 U/L (ref 38–126)
Anion gap: 9 (ref 5–15)
BUN: 14 mg/dL (ref 8–23)
CO2: 23 mmol/L (ref 22–32)
Calcium: 7.4 mg/dL — ABNORMAL LOW (ref 8.9–10.3)
Chloride: 110 mmol/L (ref 98–111)
Creatinine, Ser: 0.49 mg/dL (ref 0.44–1.00)
GFR, Estimated: >60 mL/min (ref >60)
Glucose, Bld: 107 mg/dL — ABNORMAL HIGH (ref 70–99)
Potassium: 2.9 mmol/L — ABNORMAL LOW (ref 3.5–5.1)
Sodium: 142 mmol/L (ref 135–145)
Total Bilirubin: 0.6 mg/dL (ref 0.3–1.2)
Total Protein: 5.9 g/dL — ABNORMAL LOW (ref 6.5–8.1)

## 2022-08-25 LAB — RESP PANEL BY RT-PCR (FLU A&B, COVID) ARPGX2
Influenza A by PCR: NEGATIVE
Influenza B by PCR: NEGATIVE
SARS Coronavirus 2 by RT PCR: NEGATIVE

## 2022-08-25 MED ORDER — POTASSIUM CHLORIDE CRYS ER 20 MEQ PO TBCR
40.0000 meq | EXTENDED_RELEASE_TABLET | Freq: Once | ORAL | Status: AC
  Administered 2022-08-25: 40 meq via ORAL
  Filled 2022-08-25: qty 2

## 2022-08-25 MED ORDER — ACETAMINOPHEN 650 MG RE SUPP: 650.0000 mg | Freq: Four times a day (QID) | RECTAL | Status: DC | PRN

## 2022-08-25 MED ORDER — ACETAMINOPHEN 325 MG PO TABS: 650.0000 mg | ORAL_TABLET | Freq: Four times a day (QID) | ORAL | Status: DC | PRN

## 2022-08-25 MED ORDER — ALBUTEROL SULFATE (2.5 MG/3ML) 0.083% IN NEBU
2.5000 mg | INHALATION_SOLUTION | RESPIRATORY_TRACT | Status: DC | PRN
  Administered 2022-08-28: 2.5 mg via RESPIRATORY_TRACT
  Filled 2022-08-25: qty 3

## 2022-08-25 MED ORDER — IPRATROPIUM BROMIDE 0.02 % IN SOLN
0.5000 mg | Freq: Once | RESPIRATORY_TRACT | Status: AC
  Administered 2022-08-25: 0.5 mg via RESPIRATORY_TRACT
  Filled 2022-08-25: qty 2.5

## 2022-08-25 MED ORDER — METHYLPREDNISOLONE SODIUM SUCC 125 MG IJ SOLR
80.0000 mg | Freq: Two times a day (BID) | INTRAMUSCULAR | Status: DC
  Administered 2022-08-26 – 2022-08-27 (×3): 80 mg via INTRAVENOUS
  Filled 2022-08-25 (×4): qty 2

## 2022-08-25 MED ORDER — SODIUM CHLORIDE 0.9 % IV SOLN
100.0000 mg | Freq: Two times a day (BID) | INTRAVENOUS | Status: DC
  Administered 2022-08-25 – 2022-08-27 (×4): 100 mg via INTRAVENOUS
  Filled 2022-08-25 (×4): qty 100

## 2022-08-25 MED ORDER — METHYLPREDNISOLONE SODIUM SUCC 125 MG IJ SOLR
125.0000 mg | Freq: Once | INTRAMUSCULAR | Status: AC
  Administered 2022-08-25: 125 mg via INTRAVENOUS
  Filled 2022-08-25: qty 2

## 2022-08-25 MED ORDER — IPRATROPIUM-ALBUTEROL 0.5-2.5 (3) MG/3ML IN SOLN
3.0000 mL | Freq: Four times a day (QID) | RESPIRATORY_TRACT | Status: DC
  Administered 2022-08-26 – 2022-08-27 (×7): 3 mL via RESPIRATORY_TRACT
  Filled 2022-08-25 (×7): qty 3

## 2022-08-25 MED ORDER — NICOTINE 14 MG/24HR TD PT24: 14.0000 mg | MEDICATED_PATCH | Freq: Every day | TRANSDERMAL | Status: DC | PRN

## 2022-08-25 MED ORDER — IOHEXOL 350 MG/ML SOLN
75.0000 mL | Freq: Once | INTRAVENOUS | Status: AC | PRN
  Administered 2022-08-25: 75 mL via INTRAVENOUS

## 2022-08-25 MED ORDER — ALBUTEROL SULFATE (2.5 MG/3ML) 0.083% IN NEBU
5.0000 mg | INHALATION_SOLUTION | Freq: Once | RESPIRATORY_TRACT | Status: AC
  Administered 2022-08-25: 5 mg via RESPIRATORY_TRACT
  Filled 2022-08-25: qty 6

## 2022-08-25 NOTE — ED Provider Notes (Signed)
[facility] DEPT Provider Note   CSN: [id number] Arrival date & time: 08/25/22  1515     History {Add pertinent medical, surgical, social history, OB history to HPI:1} Chief Complaint  Patient presents with   Shortness of Breath    Grace Nelson is a 71 y.o. female.   Shortness of Breath    Patient denies any significant medical history.  She states she is an everyday smoker but denies any history of breathing problems in the past.  Patient states about a week ago she started having trouble with cough and shortness of breath.  Symptoms have been progressing and she is feeling more more short of breath.  Any minimal activity makes her feel fatigued and more short of breath.  She may have had some fevers when it first started but has not had any since.  She went to her doctor's office today and they noted a low oxygen level she was sent to the ED for further evaluation.  Home Medications Prior to Admission medications   Medication Sig Start Date End Date Taking? Authorizing Provider  acetaminophen (TYLENOL) 650 MG CR tablet Take 650 mg by mouth every 8 (eight) hours as needed for pain.    [provider]  cetirizine (ZYRTEC) 10 MG tablet Take 10 mg by mouth daily.    [provider]      Allergies    Nsaids and Sulfa antibiotics    Review of Systems   Review of Systems  Respiratory:  Positive for shortness of breath.     Physical Exam Updated Vital Signs BP 125/75   Pulse 76   Temp 99.1 F (37.3 C)   Resp 17   Ht 1.575 m ('5\' 2"'$ )   Wt 77.1 kg   SpO2 92%   BMI 31.09 kg/m  Physical Exam Vitals and nursing note reviewed.  Constitutional:      General: She is not in acute distress.    Appearance: She is well-developed.  HENT:     Head: Normocephalic and atraumatic.     Right Ear: External ear normal.     Left Ear: External ear normal.  Eyes:     General: No scleral icterus.       Right eye: No discharge.         Left eye: No discharge.     Conjunctiva/sclera: Conjunctivae normal.  Neck:     Trachea: No tracheal deviation.  Cardiovascular:     Rate and Rhythm: Normal rate and regular rhythm.  Pulmonary:     Effort: Pulmonary effort is normal. No respiratory distress.     Breath sounds: No stridor. Wheezing and rales present.  Abdominal:     General: Bowel sounds are normal. There is no distension.     Palpations: Abdomen is soft.     Tenderness: There is no abdominal tenderness. There is no guarding or rebound.  Musculoskeletal:        General: No tenderness or deformity.     Cervical back: Neck supple.  Skin:    General: Skin is warm and dry.     Findings: No rash.  Neurological:     General: No focal deficit present.     Mental Status: She is alert.     Cranial Nerves: No cranial nerve deficit (no facial droop, extraocular movements intact, no slurred speech).     Sensory: No sensory deficit.     Motor: No abnormal muscle tone or seizure activity.  Coordination: Coordination normal.  Psychiatric:        Mood and Affect: Mood normal.     ED Results / Procedures / Treatments   Labs (all labs ordered are listed, but only abnormal results are displayed) Labs Reviewed  COMPREHENSIVE METABOLIC PANEL - Abnormal; Notable for the following components:      Result Value   Potassium 2.9 (*)    Glucose, Bld 107 (*)    Calcium 7.4 (*)    Total Protein 5.9 (*)    Albumin 3.0 (*)    All other components within normal limits  CBC - Abnormal; Notable for the following components:   WBC 12.6 (*)    All other components within normal limits  RESP PANEL BY RT-PCR (FLU A&B, COVID) [id number]  BRAIN NATRIURETIC PEPTIDE    EKG EKG Interpretation  Date/Time:  Monday August 25 2022 15:58:15 EDT Ventricular Rate:  84 PR Interval:  122 QRS Duration: 76 QT Interval:  355 QTC Calculation: 420 R Axis:   91 Text Interpretation: Sinus rhythm Right axis deviation Low voltage, precordial  leads Baseline wander in lead(s) V1 Confirmed by Dorie Rank [phone]) on 08/25/2022 4:28:27 PM  Radiology CT Angio Chest PE W and/or Wo Contrast  Result Date: 08/25/2022 CLINICAL DATA:  High probability for PE, cough and shortness of breath. EXAM: CT ANGIOGRAPHY CHEST WITH CONTRAST TECHNIQUE: Multidetector CT imaging of the chest was performed using the standard protocol during bolus administration of intravenous contrast. Multiplanar CT image reconstructions and MIPs were obtained to evaluate the vascular anatomy. RADIATION DOSE REDUCTION: This exam was performed according to the departmental dose-optimization program which includes automated exposure control, adjustment of the mA and/or kV according to patient size and/or use of iterative reconstruction technique. CONTRAST:  58m OMNIPAQUE IOHEXOL 350 MG/ML SOLN COMPARISON:  CT of the chest 03/25/2022 FINDINGS: Cardiovascular: Satisfactory opacification of the pulmonary arteries to the segmental level. No evidence of pulmonary embolism. Normal heart size. No pericardial effusion. There are atherosclerotic calcifications of the aorta. Mediastinum/Nodes: No enlarged mediastinal, hilar, or axillary lymph nodes. Thyroid gland, trachea, and esophagus demonstrate no significant findings. Small hiatal hernia is present. Lungs/Pleura: Mild emphysematous changes are again seen. There is atelectasis in the bilateral lower lobes/lung bases and minimally in the right upper lobe. Lungs are otherwise clear. Upper Abdomen: No acute abnormality. Musculoskeletal: No chest wall abnormality. No acute or significant osseous findings. Review of the MIP images confirms the above findings. IMPRESSION: 1. No evidence for pulmonary embolism. 2. Small amount of bilateral lower lobe atelectasis. 3. Aortic Atherosclerosis (ICD10-I70.0) and Emphysema (ICD10-J43.9). Electronically Signed   By: ARonney AstersM.D.   On: 08/25/2022 19:41   DG Chest Port 1 View  Result Date:  08/25/2022 CLINICAL DATA:  Dyspnea. Cough and shortness of breath for 7 days. Wheezing. EXAM: PORTABLE CHEST 1 VIEW COMPARISON:  03/25/2022 FINDINGS: Normal heart size. Aortic atherosclerotic calcifications. Lungs appear hyperinflated with mild diffuse coarsened interstitial markings. Scar versus platelike atelectasis noted within both lung bases. No pleural effusion or airspace consolidation. No signs of pneumothorax. IMPRESSION: 1. Bibasilar scarring versus atelectasis. 2. Emphysema. 3. Aortic atherosclerotic disease. Electronically Signed   By: TKerby MoorsM.D.   On: 08/25/2022 16:44    Procedures Procedures  {Document cardiac monitor, telemetry assessment procedure when appropriate:1}  Medications Ordered in ED Medications  methylPREDNISolone sodium succinate (SOLU-MEDROL) 125 mg/2 mL injection 125 mg (125 mg Intravenous Given 08/25/22 1616)  albuterol (PROVENTIL) (2.5 MG/3ML) 0.083% nebulizer solution 5 mg (5 mg Nebulization Given  08/25/22 1616)  ipratropium (ATROVENT) nebulizer solution 0.5 mg (0.5 mg Nebulization Given 08/25/22 1616)  potassium chloride SA (KLOR-CON M) CR tablet 40 mEq (40 mEq Oral Given 08/25/22 1839)  iohexol (OMNIPAQUE) 350 MG/ML injection 75 mL (75 mLs Intravenous Contrast Given 08/25/22 1914)    ED Course/ Medical Decision Making/ A&P Clinical Course as of 08/25/22 2032  Mon Aug 25, 2022  1701 Chest x-ray shows emphysematous changes.  No infiltrate noted [JK]  1701 Patient with significant oxygen requirement.  She denies any prior history of significant pulmonary disease.  COPD exacerbation is a consideration but with her new oxygen requirement will CT to rule out pulmonary embolism [JK]  1701 CBC(!) Leukocytosis noted [JK]  2031 Resp Panel by RT-PCR (Flu A&B, Covid) Anterior Nasal Swab [JK]  2031 Give [JK]  2031 Comprehensive metabolic panel(!) Normal except for hypokalemia [JK]    Clinical Course User Index [JK] Dorie Rank, MD                            Medical Decision Making Differential diagnosis includes but not limited to CHF pneumonia bronchitis with bronchospasm associated with her smoking history  Problems Addressed: COPD exacerbation ([place]): acute illness or injury that poses a threat to life or bodily functions Hypokalemia: acute illness or injury    Details: Likely y related breathing treatments  Amount and/or Complexity of Data Reviewed Labs: ordered. Decision-making details documented in ED Course. Radiology: ordered and independent interpretation performed.    Details: Chest x-ray showed emphysema without pneumonia.  CT angio did not show PE  Risk Prescription drug management.   Presented to ED for evaluation of shortness of breath.  Patient has a long smoking history but no formal history of COPD or emphysema.  She is not normally on oxygen or breathing treatments at home.  Patient was noted to have significant wheezing.  She was treated with albuterol treatments by EMS as well as in the ED.  She was also given IV steroids.  With her new oxygen requirement CT angio was performed to rule out PE.  This was negative.  Patient is feeling better and is breathing more easily although she still has some slight wheezing.  Patient however still has an oxygen requirement.  We will consult the medical service for admission. {Document critical care time when appropriate:1} {Document review of labs and clinical decision tools ie heart score, Chads2Vasc2 etc:1}  {Document your independent review of radiology images, and any outside records:1} {Document your discussion with family members, caretakers, and with consultants:1} {Document social determinants of health affecting pt's care:1} {Document your decision making why or why not admission, treatments were needed:1} Final Clinical Impression(s) / ED Diagnoses Final diagnoses:  COPD exacerbation ([place])  Hypokalemia    Rx / DC Orders ED Discharge Orders     None

## 2022-08-25 NOTE — H&P (Signed)
History and Physical    PLEASE NOTE THAT [employer]AGON DI[employer] SOFTWARE WAS USED IN THE CONSTRUCTION OF THIS NOTE.   Grace Nelson DQQ:[id number] DOB: [birth year] DOA: 08/25/2022  PCP: [name], MD *** Patient coming from: home ***  I have personally briefly reviewed patient's old medical records in [place]  Chief Complaint: ***  HPI: Grace Nelson is a 71 y.o. female with medical history significant for *** who is admitted to [facility] on 08/25/2022 with *** after presenting from home*** to [facility] ED complaining of ***.    ***    ***SOB: Denies any associated orthopnea, PND, or new onset peripheral edema. No recent chest pain, diaphoresis, palpitations, N/V, pre-syncope, or syncope. Not associated with any recent cough, wheezing, hemoptysis, new lower extremity erythema, or calf tenderness. Denies any recent trauma, travel, surgical procedures, or periods of prolonged diminished ambulatory status. No recent melena or hematochezia.   Denies any associated subjective fever, chills, rigors, or generalized myalgias. No recent headache, neck stiffness, rhinitis, rhinorrhea, sore throat, abdominal pain, diarrhea, or rash. No known recent COVID-19 exposures. Denies dysuria, gross hematuria, or change in urinary urgency/frequency.  ***   ***misc/infectious: Denies any subjective fever, chills, rigors, or generalized myalgias. Denies any recent headache, neck stiffness, rhinitis, rhinorrhea, sore throat, sob, wheezing, cough, nausea, vomiting, abdominal pain, diarrhea, or rash. No recent traveling or known COVID-19 exposures. Denies dysuria, gross hematuria, or change in urinary urgency/frequency.  Denies any recent chest pain, diaphoresis, or palpitations. ***    ED Course:  Vital signs in the ED were notable for the following: ***  Labs were notable for the following: ***  Imaging and additional notable ED work-up: ***  While in the ED, the following were  administered: ***  Subsequently, the patient was admitted  ***  ***red   Review of Systems: As per HPI otherwise 10 point review of systems negative.   Past Medical History:  Diagnosis Date   Depression    Osteopenia     Past Surgical History:  Procedure Laterality Date   ABDOMINAL HYSTERECTOMY     APPENDECTOMY      Social History:  reports that she has been smoking cigarettes. She has a 45.00 pack-year smoking history. She has never used smokeless tobacco. She reports that she does not drink alcohol and does not use drugs.   Allergies  Allergen Reactions   Nsaids    Sulfa Antibiotics Hives    Family History  Problem Relation Age of Onset   Ulcers Mother        pancreatic bleeding ulcer   Hypertension Mother    Cancer Mother        colon   COPD Father    Cancer Father        lung   Heart disease Father        before age 69   Heart attack Father    Kidney disease Brother     Family history reviewed and not pertinent ***   Prior to Admission medications   Medication Sig Start Date End Date Taking? Authorizing Provider  acetaminophen (TYLENOL) 650 MG CR tablet Take 650 mg by mouth every 8 (eight) hours as needed for pain.    [provider]  cetirizine (ZYRTEC) 10 MG tablet Take 10 mg by mouth daily.    [provider]     Objective    Physical Exam: Vitals:   08/25/22 1900 08/25/22 1943 08/25/22 2000 08/25/22 2030  BP: 112/66 128/73  114/65 125/75  Pulse: 76 85 77 76  Resp: 20 19 (!) 22 17  Temp:  99.1 F (37.3 C)    TempSrc:      SpO2: 93% 96% 93% 92%  Weight:      Height:        General: appears to be stated age; alert, oriented Skin: warm, dry, no rash Head:  AT/[place] Mouth:  Oral mucosa membranes appear moist, normal dentition Neck: supple; trachea midline Heart:  RRR; did not appreciate any M/R/G Lungs: CTAB, did not appreciate any wheezes, rales, or rhonchi Abdomen: + BS; soft, ND, NT Vascular: 2+ pedal pulses b/l; 2+  radial pulses b/l Extremities: no peripheral edema, no muscle wasting Neuro: strength and sensation intact in upper and lower extremities b/l    *** Neuro: 5/5 strength of the proximal and distal flexors and extensors of the upper and lower extremities bilaterally; sensation intact in upper and lower extremities b/l; cranial nerves II through XII grossly intact; no pronator drift; no evidence suggestive of slurred speech, dysarthria, or facial droop; Normal muscle tone. No tremors. *** Neuro: In the setting of the patient's current mental status and associated inability to follow instructions, unable to perform full neurologic exam at this time.  As such, assessment of strength, sensation, and cranial nerves is limited at this time. Patient noted to spontaneously move all 4 extremities. No tremors.  ***    Labs on Admission: I have personally reviewed following labs and imaging studies  CBC: Recent Labs  Lab 08/25/22 1608  WBC 12.6*  HGB 14.4  HCT 45.7  MCV 93.3  PLT 694   Basic Metabolic Panel: Recent Labs  Lab 08/25/22 1608  NA 142  K 2.9*  CL 110  CO2 23  GLUCOSE 107*  BUN 14  CREATININE 0.49  CALCIUM 7.4*   GFR: Estimated Creatinine Clearance: 60.2 mL/min (by C-G formula based on SCr of 0.49 mg/dL). Liver Function Tests: Recent Labs  Lab 08/25/22 1608  AST 15  ALT 9  ALKPHOS 42  BILITOT 0.6  PROT 5.9*  ALBUMIN 3.0*   No results for input(s): "LIPASE", "AMYLASE" in the last 168 hours. No results for input(s): "AMMONIA" in the last 168 hours. Coagulation Profile: No results for input(s): "INR", "PROTIME" in the last 168 hours. Cardiac Enzymes: No results for input(s): "CKTOTAL", "CKMB", "CKMBINDEX", "TROPONINI" in the last 168 hours. BNP (last 3 results) No results for input(s): "PROBNP" in the last 8760 hours. HbA1C: No results for input(s): "HGBA1C" in the last 72 hours. CBG: No results for input(s): "GLUCAP" in the last 168 hours. Lipid  Profile: No results for input(s): "CHOL", "HDL", "LDLCALC", "TRIG", "CHOLHDL", "LDLDIRECT" in the last 72 hours. Thyroid Function Tests: No results for input(s): "TSH", "T4TOTAL", "FREET4", "T3FREE", "THYROIDAB" in the last 72 hours. Anemia Panel: No results for input(s): "VITAMINB12", "FOLATE", "FERRITIN", "TIBC", "IRON", "RETICCTPCT" in the last 72 hours. Urine analysis: No results found for: "COLORURINE", "APPEARANCEUR", "LABSPEC", "PHURINE", "GLUCOSEU", "HGBUR", "BILIRUBINUR", "KETONESUR", "PROTEINUR", "UROBILINOGEN", "NITRITE", "LEUKOCYTESUR"  Radiological Exams on Admission: CT Angio Chest PE W and/or Wo Contrast  Result Date: 08/25/2022 CLINICAL DATA:  High probability for PE, cough and shortness of breath. EXAM: CT ANGIOGRAPHY CHEST WITH CONTRAST TECHNIQUE: Multidetector CT imaging of the chest was performed using the standard protocol during bolus administration of intravenous contrast. Multiplanar CT image reconstructions and MIPs were obtained to evaluate the vascular anatomy. RADIATION DOSE REDUCTION: This exam was performed according to the departmental dose-optimization program which includes automated exposure control, adjustment  of the mA and/or kV according to patient size and/or use of iterative reconstruction technique. CONTRAST:  34m OMNIPAQUE IOHEXOL 350 MG/ML SOLN COMPARISON:  CT of the chest 03/25/2022 FINDINGS: Cardiovascular: Satisfactory opacification of the pulmonary arteries to the segmental level. No evidence of pulmonary embolism. Normal heart size. No pericardial effusion. There are atherosclerotic calcifications of the aorta. Mediastinum/Nodes: No enlarged mediastinal, hilar, or axillary lymph nodes. Thyroid gland, trachea, and esophagus demonstrate no significant findings. Small hiatal hernia is present. Lungs/Pleura: Mild emphysematous changes are again seen. There is atelectasis in the bilateral lower lobes/lung bases and minimally in the right upper lobe. Lungs are  otherwise clear. Upper Abdomen: No acute abnormality. Musculoskeletal: No chest wall abnormality. No acute or significant osseous findings. Review of the MIP images confirms the above findings. IMPRESSION: 1. No evidence for pulmonary embolism. 2. Small amount of bilateral lower lobe atelectasis. 3. Aortic Atherosclerosis (ICD10-I70.0) and Emphysema (ICD10-J43.9). Electronically Signed   By: ARonney AstersM.D.   On: 08/25/2022 19:41   DG Chest Port 1 View  Result Date: 08/25/2022 CLINICAL DATA:  Dyspnea. Cough and shortness of breath for 7 days. Wheezing. EXAM: PORTABLE CHEST 1 VIEW COMPARISON:  03/25/2022 FINDINGS: Normal heart size. Aortic atherosclerotic calcifications. Lungs appear hyperinflated with mild diffuse coarsened interstitial markings. Scar versus platelike atelectasis noted within both lung bases. No pleural effusion or airspace consolidation. No signs of pneumothorax. IMPRESSION: 1. Bibasilar scarring versus atelectasis. 2. Emphysema. 3. Aortic atherosclerotic disease. Electronically Signed   By: TKerby MoorsM.D.   On: 08/25/2022 16:44     EKG: Independently reviewed, with result as described above. ***   Assessment/Plan    Principal Problem:   Acute exacerbation of chronic obstructive pulmonary disease (COPD) (HCC)  ***      ***          ***           ***          ***          ***          ***          ***          ***     ***  DVT prophylaxis: SCD's ***  Code Status: Full code*** Family Communication: none*** Disposition Plan: Per Rounding Team Consults called: none***;  Admission status: ***   PLEASE NOTE THAT DRAGON DICT[employer] SOFTWARE WAS USED IN THE CONSTRUCTION OF THIS NOTE.   JManitowocDO [facility]  08/25/2022, 9:19 PM   ***

## 2022-08-25 NOTE — ED Triage Notes (Signed)
Pt presents via EMS from an MD office with c/o cough and shortness of breath for 7 days. Pt was given a duoneb en route by EMS x 2. Pt has wheezing in all lobes heard by EMS. Pt was negative for covid and flu and RSV at the MD office.

## 2022-08-25 NOTE — ED Notes (Addendum)
Pt placed on 2L supplemental O2 for O2 sat of 86% on RA. No improvement, increased to 3L no improvement. Increased to 4L no improvement. Switched to non rebreather w/ improvement to 90%.  Dr. Tomi Bamberger at bedside.

## 2022-08-26 ENCOUNTER — Encounter (HOSPITAL_COMMUNITY): Payer: Self-pay | Admitting: Internal Medicine

## 2022-08-26 DIAGNOSIS — E876 Hypokalemia: Secondary | ICD-10-CM | POA: Diagnosis present

## 2022-08-26 DIAGNOSIS — J309 Allergic rhinitis, unspecified: Secondary | ICD-10-CM | POA: Diagnosis present

## 2022-08-26 DIAGNOSIS — D72829 Elevated white blood cell count, unspecified: Secondary | ICD-10-CM | POA: Diagnosis present

## 2022-08-26 DIAGNOSIS — E785 Hyperlipidemia, unspecified: Secondary | ICD-10-CM | POA: Diagnosis present

## 2022-08-26 DIAGNOSIS — T380X5A Adverse effect of glucocorticoids and synthetic analogues, initial encounter: Secondary | ICD-10-CM | POA: Diagnosis present

## 2022-08-26 DIAGNOSIS — R531 Weakness: Secondary | ICD-10-CM | POA: Diagnosis not present

## 2022-08-26 DIAGNOSIS — Z79899 Other long term (current) drug therapy: Secondary | ICD-10-CM | POA: Diagnosis not present

## 2022-08-26 DIAGNOSIS — F17210 Nicotine dependence, cigarettes, uncomplicated: Secondary | ICD-10-CM | POA: Diagnosis present

## 2022-08-26 DIAGNOSIS — J9601 Acute respiratory failure with hypoxia: Secondary | ICD-10-CM | POA: Diagnosis present

## 2022-08-26 DIAGNOSIS — J441 Chronic obstructive pulmonary disease with (acute) exacerbation: Secondary | ICD-10-CM | POA: Diagnosis present

## 2022-08-26 DIAGNOSIS — Z20822 Contact with and (suspected) exposure to covid-19: Secondary | ICD-10-CM | POA: Diagnosis present

## 2022-08-26 DIAGNOSIS — Z882 Allergy status to sulfonamides status: Secondary | ICD-10-CM | POA: Diagnosis not present

## 2022-08-26 DIAGNOSIS — R0602 Shortness of breath: Secondary | ICD-10-CM | POA: Diagnosis not present

## 2022-08-26 DIAGNOSIS — Z886 Allergy status to analgesic agent status: Secondary | ICD-10-CM | POA: Diagnosis not present

## 2022-08-26 DIAGNOSIS — Z720 Tobacco use: Secondary | ICD-10-CM | POA: Diagnosis present

## 2022-08-26 DIAGNOSIS — F32A Depression, unspecified: Secondary | ICD-10-CM | POA: Diagnosis present

## 2022-08-26 DIAGNOSIS — R6510 Systemic inflammatory response syndrome (SIRS) of non-infectious origin without acute organ dysfunction: Secondary | ICD-10-CM | POA: Diagnosis present

## 2022-08-26 DIAGNOSIS — Z825 Family history of asthma and other chronic lower respiratory diseases: Secondary | ICD-10-CM | POA: Diagnosis not present

## 2022-08-26 DIAGNOSIS — J9811 Atelectasis: Secondary | ICD-10-CM | POA: Diagnosis present

## 2022-08-26 DIAGNOSIS — Z91048 Other nonmedicinal substance allergy status: Secondary | ICD-10-CM | POA: Diagnosis not present

## 2022-08-26 LAB — PHOSPHORUS: Phosphorus: 3.5 mg/dL (ref 2.5–4.6)

## 2022-08-26 LAB — CBC WITH DIFFERENTIAL/PLATELET
Abs Immature Granulocytes: 0.04 10*3/uL (ref 0.00–0.07)
Basophils Absolute: 0 10*3/uL (ref 0.0–0.1)
Basophils Relative: 0 %
Eosinophils Absolute: 0 10*3/uL (ref 0.0–0.5)
Eosinophils Relative: 0 %
HCT: 43.2 % (ref 36.0–46.0)
Hemoglobin: 13.8 g/dL (ref 12.0–15.0)
Immature Granulocytes: 0 %
Lymphocytes Relative: 10 %
Lymphs Abs: 1.1 10*3/uL (ref 0.7–4.0)
MCH: 29.4 pg (ref 26.0–34.0)
MCHC: 31.9 g/dL (ref 30.0–36.0)
MCV: 91.9 fL (ref 80.0–100.0)
Monocytes Absolute: 0.3 10*3/uL (ref 0.1–1.0)
Monocytes Relative: 3 %
Neutro Abs: 9.3 10*3/uL — ABNORMAL HIGH (ref 1.7–7.7)
Neutrophils Relative %: 87 %
Platelets: 255 10*3/uL (ref 150–400)
RBC: 4.7 MIL/uL (ref 3.87–5.11)
RDW: 14 % (ref 11.5–15.5)
WBC: 10.8 10*3/uL — ABNORMAL HIGH (ref 4.0–10.5)
nRBC: 0 % (ref 0.0–0.2)

## 2022-08-26 LAB — MAGNESIUM: Magnesium: 1.7 mg/dL (ref 1.7–2.4)

## 2022-08-26 LAB — COMPREHENSIVE METABOLIC PANEL
ALT: 10 U/L (ref 0–44)
AST: 12 U/L — ABNORMAL LOW (ref 15–41)
Albumin: 3.4 g/dL — ABNORMAL LOW (ref 3.5–5.0)
Alkaline Phosphatase: 48 U/L (ref 38–126)
Anion gap: 8 (ref 5–15)
BUN: 15 mg/dL (ref 8–23)
CO2: 26 mmol/L (ref 22–32)
Calcium: 9.3 mg/dL (ref 8.9–10.3)
Chloride: 108 mmol/L (ref 98–111)
Creatinine, Ser: 0.47 mg/dL (ref 0.44–1.00)
GFR, Estimated: >60 mL/min (ref >60)
Glucose, Bld: 142 mg/dL — ABNORMAL HIGH (ref 70–99)
Potassium: 4.9 mmol/L (ref 3.5–5.1)
Sodium: 142 mmol/L (ref 135–145)
Total Bilirubin: 0.3 mg/dL (ref 0.3–1.2)
Total Protein: 6.5 g/dL (ref 6.5–8.1)

## 2022-08-26 LAB — BLOOD GAS, VENOUS
Acid-Base Excess: 2.5 mmol/L — ABNORMAL HIGH (ref 0.0–2.0)
Bicarbonate: 28.4 mmol/L — ABNORMAL HIGH (ref 20.0–28.0)
O2 Saturation: 92.4 %
Patient temperature: 37
pCO2, Ven: 48 mmHg (ref 44–60)
pH, Ven: 7.38 (ref 7.25–7.43)
pO2, Ven: 65 mmHg — ABNORMAL HIGH (ref 32–45)

## 2022-08-26 LAB — PROCALCITONIN: Procalcitonin: <0.1 ng/mL

## 2022-08-26 LAB — TSH: TSH: 0.521 u[IU]/mL (ref 0.350–4.500)

## 2022-08-26 MED ORDER — LORATADINE 10 MG PO TABS
10.0000 mg | ORAL_TABLET | Freq: Every day | ORAL | Status: DC
  Administered 2022-08-26 – 2022-08-29 (×4): 10 mg via ORAL
  Filled 2022-08-26 (×4): qty 1

## 2022-08-26 MED ORDER — ENOXAPARIN SODIUM 40 MG/0.4ML IJ SOSY
40.0000 mg | PREFILLED_SYRINGE | INTRAMUSCULAR | Status: DC
  Administered 2022-08-26 – 2022-08-29 (×4): 40 mg via SUBCUTANEOUS
  Filled 2022-08-26 (×4): qty 0.4

## 2022-08-26 MED ORDER — SIMVASTATIN 20 MG PO TABS
20.0000 mg | ORAL_TABLET | Freq: Every day | ORAL | Status: DC
  Administered 2022-08-26 – 2022-08-28 (×3): 20 mg via ORAL
  Filled 2022-08-26 (×3): qty 1

## 2022-08-26 NOTE — Evaluation (Signed)
Physical Therapy One Time Evaluation Patient Details Name: Grace Nelson MRN: [id number] DOB: 19-Jan-1951 Today's Date: 08/26/2022  History of Present Illness  71 y.o. female with medical history significant for chronic tobacco abuse, allergic rhinitis, hyperlipidemia, who is admitted to [facility] on 08/25/2022 for Acute respiratory failure with hypoxia secondary to acute COPD exacerbation  Clinical Impression  Patient evaluated by Physical Therapy with no further acute PT needs identified. All education has been completed and the patient has no further questions.  Pt very eager to ambulate.  Pt's SPO2 monitored during ambulation and pt needing at least 5L O2 [place] during ambulation (see mobility section below).  Pt requested to wean oxygen but explained her liters and SPO2 with activity.  Pt aware she likely needs at least 5L at this time with activity.  Pt states she has ordered pulse oximeter for home already.  Pt hopeful for no O2 needs upon d/c. See below for any follow-up Physical Therapy or equipment needs. PT is signing off. Thank you for this referral.        Recommendations for follow up therapy are one component of a multi-disciplinary discharge planning process, led by the attending physician.  Recommendations may be updated based on patient status, additional functional criteria and insurance authorization.  Follow Up Recommendations Other (comment) (pulmonary rehab if MD feels appropriate)      Assistance Recommended at Discharge PRN  Patient can return home with the following       Equipment Recommendations None recommended by PT  Recommendations for Other Services       Functional Status Assessment Patient has had a recent decline in their functional status and demonstrates the ability to make significant improvements in function in a reasonable and predictable amount of time.     Precautions / Restrictions Precautions Precautions: Other  (comment) Precaution Comments: monitor O2      Mobility  Bed Mobility Overal bed mobility: Modified Independent                  Transfers Overall transfer level: Modified independent                      Ambulation/Gait Ambulation/Gait assistance: Supervision, Modified independent (Device/Increase time) Gait Distance (Feet): 400 Feet (x2) Assistive device: None Gait Pattern/deviations: WFL(Within Functional Limits)       General Gait Details: only supervision for lines/O2, monitored SPO2 during ambulation, pt wished to try decreasing liters however Spo2 87% on 3L O2 [place] during ambulation (400 ft), pt wanted to ambulate again so performed another 400 ft on 4L O2 [place] and SPO2 dropped to 85%, pt returned to 5L O2 [place] end of session and SpO2 89% upon therapist leaving room  Stairs            Wheelchair Mobility    Modified Rankin (Stroke Patients Only)       Balance Overall balance assessment: No apparent balance deficits (not formally assessed)                                           Pertinent Vitals/Pain Pain Assessment Pain Assessment: No/denies pain    Home Living Family/patient expects to be discharged to:: Private residence Living Arrangements: Spouse/significant other   Type of Home: House         Home Layout: Able to live on main level with bedroom/bathroom;Laundry  or work area in [employer]: None      Prior Function Prior Level of Function : Independent/Modified Independent                     Journalist, newspaper        Extremity/Trunk Assessment        Lower Extremity Assessment Lower Extremity Assessment: Overall WFL for tasks assessed    Cervical / Trunk Assessment Cervical / Trunk Assessment: Normal  Communication   Communication: No difficulties  Cognition Arousal/Alertness: Awake/alert Behavior During Therapy: WFL for tasks assessed/performed Overall Cognitive Status: Within  Functional Limits for tasks assessed                                          General Comments      Exercises     Assessment/Plan    PT Assessment Patient does not need any further PT services  PT Problem List         PT Treatment Interventions      PT Goals (Current goals can be found in the Care Plan section)  Acute Rehab PT Goals PT Goal Formulation: All assessment and education complete, DC therapy    Frequency       Co-evaluation               AM-PAC PT "6 Clicks" Mobility  Outcome Measure Help needed turning from your back to your side while in a flat bed without using bedrails?: None Help needed moving from lying on your back to sitting on the side of a flat bed without using bedrails?: None Help needed moving to and from a bed to a chair (including a wheelchair)?: None Help needed standing up from a chair using your arms (e.g., wheelchair or bedside chair)?: None Help needed to walk in hospital room?: A Little Help needed climbing 3-5 steps with a railing? : A Little 6 Click Score: 22    End of Session Equipment Utilized During Treatment: Oxygen Activity Tolerance: Patient tolerated treatment well Patient left: in bed;with call bell/phone within reach;with family/visitor present   PT Visit Diagnosis: Difficulty in walking, not elsewhere classified (R26.2)    Time: 1025-8527 PT Time Calculation (min) (ACUTE ONLY): 19 min   Charges:   PT Evaluation $PT Eval Low Complexity: 1 Low         Kati PT, DPT Physical Therapist Acute Rehabilitation Services Preferred contact method: Secure Chat Weekend Pager Only: [phone] Office: [place] 08/26/2022, 4:22 PM

## 2022-08-26 NOTE — Progress Notes (Signed)
PROGRESS NOTE    Grace Nelson  XNA:[id number] DOB: May 02, 1951 DOA: 08/25/2022 PCP: [name], MD    Chief Complaint  Patient presents with   Shortness of Breath    Brief Narrative:  Grace Nelson is a 71 y.o. female with medical history significant for chronic tobacco abuse, allergic rhinitis, hyperlipidemia, who is admitted to [facility] on 08/25/2022 with acute hypoxic respiratory failure in the setting of suspected new diagnosis of acute COPD exacerbation . COVID-19/flu PCR negative. CTA chest with PE protocol showed no evidence of acute pulmonary embolism, while showing evidence of bibasilar atelectasis, in the absence of any evidence of overt infiltrate, edema, effusion, or pneumothorax.    Assessment & Plan:   Principal Problem:   Acute exacerbation of chronic obstructive pulmonary disease (COPD) (HCC) Active Problems:   Acute respiratory failure with hypoxia (HCC)   Hypokalemia   Generalized weakness   SIRS (systemic inflammatory response syndrome) (HCC)   Tobacco abuse   Allergic rhinitis   HLD (hyperlipidemia)   Acute respiratory failure with hypoxia secondary to acute COPD exacerbation. Currently requiring up to 5 L of nasal cannula oxygen. No evidence of acute PE or pneumonia on CTA. Influenza and COVID-19 PCR is negative. Recommend to continue with Solu-Medrol, scheduled DuoNebs, and doxycycline for acute bronchitis. Procalcitonin is negative, continue with flutter valve and incentive spirometry. Mild leukocytosis. Monitor.    Hypokalemia Replaced    Tobacco abuse Nicotine patch    SIRS criteria Improving parameters, negative procalcitonin   Generalized weakness Therapy evaluations ordered TSH within normal limits   DVT prophylaxis: (Lovenox) Code Status: (Full code) Family Communication: family at bedside.  Disposition:   Status is: Observation The patient will require care spanning > 2 midnights and should be  moved to inpatient because: IV solumedrol.    Level of care: Progressive Consultants:  None.   Procedures: None.  Antimicrobials: doxycycline.    Subjective: Breathing has improved. Still has a dry cough. On 5 lit of [place] oxygen.   Objective: Vitals:   08/26/22 0900 08/26/22 0902 08/26/22 1000 08/26/22 1025  BP: 126/68 126/68 132/71   Pulse: 71 62 69 66  Resp:  '18 18 18  '$ Temp:   98.2 F (36.8 C)   TempSrc:      SpO2: 96% (!) 88% (!) 89% 91%  Weight:      Height:        Intake/Output Summary (Last 24 hours) at 08/26/2022 1105 Last data filed at 08/26/2022 0037 Gross per 24 hour  Intake 250 ml  Output --  Net 250 ml   Filed Weights   08/25/22 1557  Weight: 77.1 kg    Examination:  General exam: Appears calm and comfortable  Respiratory system: scattered wheezing posteriorly.  Cardiovascular system: S1 & S2 heard, RRR. No JVD,  No pedal edema. Gastrointestinal system: Abdomen is nondistended, soft and nontender. No organomegaly or masses felt. Normal bowel sounds heard. Central nervous system: Alert and oriented. No focal neurological deficits. Extremities: Symmetric 5 x 5 power. Skin: No rashes, lesions or ulcers Psychiatry: Mood & affect appropriate.     Data Reviewed: I have personally reviewed following labs and imaging studies  CBC: Recent Labs  Lab 08/25/22 1608 08/26/22 0600  WBC 12.6* 10.8*  NEUTROABS  --  9.3*  HGB 14.4 13.8  HCT 45.7 43.2  MCV 93.3 91.9  PLT 237 542    Basic Metabolic Panel: Recent Labs  Lab 08/25/22 1608 08/26/22 0600  NA 142 142  K 2.9* 4.9  CL 110 108  CO2 23 26  GLUCOSE 107* 142*  BUN 14 15  CREATININE 0.49 0.47  CALCIUM 7.4* 9.3  MG  --  1.7  PHOS  --  3.5    GFR: Estimated Creatinine Clearance: 60.2 mL/min (by C-G formula based on SCr of 0.47 mg/dL).  Liver Function Tests: Recent Labs  Lab 08/25/22 1608 08/26/22 0600  AST 15 12*  ALT 9 10  ALKPHOS 42 48  BILITOT 0.6 0.3  PROT 5.9* 6.5  ALBUMIN  3.0* 3.4*    CBG: No results for input(s): "GLUCAP" in the last 168 hours.   Recent Results (from the past 240 hour(s))  Resp Panel by RT-PCR (Flu A&B, Covid) Anterior Nasal Swab     Status: None   Collection Time: 08/25/22  4:08 PM   Specimen: Anterior Nasal Swab  Result Value Ref Range Status   SARS Coronavirus 2 by RT PCR NEGATIVE NEGATIVE Final    Comment: (NOTE) SARS-CoV-2 target nucleic acids are NOT DETECTED.  The SARS-CoV-2 RNA is generally detectable in upper respiratory specimens during the acute phase of infection. The lowest concentration of SARS-CoV-2 viral copies this assay can detect is 138 copies/mL. A negative result does not preclude SARS-Cov-2 infection and should not be used as the sole basis for treatment or other patient management decisions. A negative result may occur with  improper specimen collection/handling, submission of specimen other than nasopharyngeal swab, presence of viral mutation(s) within the areas targeted by this assay, and inadequate number of viral copies(<138 copies/mL). A negative result must be combined with clinical observations, patient history, and epidemiological information. The expected result is Negative.  Fact Sheet for Patients:  EntrepreneurPulse.com.au  Fact Sheet for Healthcare Providers:  IncredibleEmployment.be  This test is no t yet approved or cleared by the [place] FDA and  has been authorized for detection and/or diagnosis of SARS-CoV-2 by FDA under an Emergency Use Authorization (EUA). This EUA will remain  in effect (meaning this test can be used) for the duration of the COVID-19 declaration under Section 564(b)(1) of the Act, 21 U.S.C.section 360bbb-3(b)(1), unless the authorization is terminated  or revoked sooner.       Influenza A by PCR NEGATIVE NEGATIVE Final   Influenza B by PCR NEGATIVE NEGATIVE Final    Comment: (NOTE) The Xpert Xpress SARS-CoV-2/FLU/RSV  plus assay is intended as an aid in the diagnosis of influenza from Nasopharyngeal swab specimens and should not be used as a sole basis for treatment. Nasal washings and aspirates are unacceptable for Xpert Xpress SARS-CoV-2/FLU/RSV testing.  Fact Sheet for Patients: EntrepreneurPulse.com.au  Fact Sheet for Healthcare Providers: IncredibleEmployment.be  This test is not yet approved or cleared by the [place] FDA and has been authorized for detection and/or diagnosis of SARS-CoV-2 by FDA under an Emergency Use Authorization (EUA). This EUA will remain in effect (meaning this test can be used) for the duration of the COVID-19 declaration under Section 564(b)(1) of the Act, 21 U.S.C. section 360bbb-3(b)(1), unless the authorization is terminated or revoked.  Performed at [facility], [place] [address] [place], [place] [address]          Radiology Studies: CT Angio Chest PE W and/or Wo Contrast  Result Date: 08/25/2022 CLINICAL DATA:  High probability for PE, cough and shortness of breath. EXAM: CT ANGIOGRAPHY CHEST WITH CONTRAST TECHNIQUE: Multidetector CT imaging of the chest was performed using the standard protocol during bolus administration of intravenous contrast. Multiplanar CT  image reconstructions and MIPs were obtained to evaluate the vascular anatomy. RADIATION DOSE REDUCTION: This exam was performed according to the departmental dose-optimization program which includes automated exposure control, adjustment of the mA and/or kV according to patient size and/or use of iterative reconstruction technique. CONTRAST:  49m OMNIPAQUE IOHEXOL 350 MG/ML SOLN COMPARISON:  CT of the chest 03/25/2022 FINDINGS: Cardiovascular: Satisfactory opacification of the pulmonary arteries to the segmental level. No evidence of pulmonary embolism. Normal heart size. No pericardial effusion. There are atherosclerotic calcifications of the  aorta. Mediastinum/Nodes: No enlarged mediastinal, hilar, or axillary lymph nodes. Thyroid gland, trachea, and esophagus demonstrate no significant findings. Small hiatal hernia is present. Lungs/Pleura: Mild emphysematous changes are again seen. There is atelectasis in the bilateral lower lobes/lung bases and minimally in the right upper lobe. Lungs are otherwise clear. Upper Abdomen: No acute abnormality. Musculoskeletal: No chest wall abnormality. No acute or significant osseous findings. Review of the MIP images confirms the above findings. IMPRESSION: 1. No evidence for pulmonary embolism. 2. Small amount of bilateral lower lobe atelectasis. 3. Aortic Atherosclerosis (ICD10-I70.0) and Emphysema (ICD10-J43.9). Electronically Signed   By: ARonney AstersM.D.   On: 08/25/2022 19:41   DG Chest Port 1 View  Result Date: 08/25/2022 CLINICAL DATA:  Dyspnea. Cough and shortness of breath for 7 days. Wheezing. EXAM: PORTABLE CHEST 1 VIEW COMPARISON:  03/25/2022 FINDINGS: Normal heart size. Aortic atherosclerotic calcifications. Lungs appear hyperinflated with mild diffuse coarsened interstitial markings. Scar versus platelike atelectasis noted within both lung bases. No pleural effusion or airspace consolidation. No signs of pneumothorax. IMPRESSION: 1. Bibasilar scarring versus atelectasis. 2. Emphysema. 3. Aortic atherosclerotic disease. Electronically Signed   By: TKerby MoorsM.D.   On: 08/25/2022 16:44        Scheduled Meds:  ipratropium-albuterol  3 mL Nebulization Q6H   loratadine  10 mg Oral Daily   methylPREDNISolone (SOLU-MEDROL) injection  80 mg Intravenous Q12H   simvastatin  20 mg Oral QHS   Continuous Infusions:  doxycycline (VIBRAMYCIN) IV 100 mg (08/26/22 0958)     LOS: 0 days    Time spent: 38 minutes.     VHosie Poisson MD [facility]   To contact the attending provider between 7A-7P or the covering provider during after hours 7P-7A, please log into the web site  [email or web address] and access using universal [place] password for that web site. If you do not have the password, please call the hospital operator.  08/26/2022, 11:05 AM

## 2022-08-26 NOTE — ED Notes (Signed)
Resp aware of SPO2

## 2022-08-26 NOTE — ED Notes (Signed)
Asked Network engineer to page MD to see if she is going to remain NPO or place diet order due to patient stating dr said she could eat. Pt is also requesting pain medication.

## 2022-08-27 DIAGNOSIS — R531 Weakness: Secondary | ICD-10-CM | POA: Diagnosis not present

## 2022-08-27 DIAGNOSIS — J441 Chronic obstructive pulmonary disease with (acute) exacerbation: Secondary | ICD-10-CM | POA: Diagnosis not present

## 2022-08-27 DIAGNOSIS — J309 Allergic rhinitis, unspecified: Secondary | ICD-10-CM | POA: Diagnosis not present

## 2022-08-27 DIAGNOSIS — J9601 Acute respiratory failure with hypoxia: Secondary | ICD-10-CM | POA: Diagnosis not present

## 2022-08-27 LAB — CBC WITH DIFFERENTIAL/PLATELET
Abs Immature Granulocytes: 0.15 10*3/uL — ABNORMAL HIGH (ref 0.00–0.07)
Basophils Absolute: 0 10*3/uL (ref 0.0–0.1)
Basophils Relative: 0 %
Eosinophils Absolute: 0 10*3/uL (ref 0.0–0.5)
Eosinophils Relative: 0 %
HCT: 44.8 % (ref 36.0–46.0)
Hemoglobin: 13.8 g/dL (ref 12.0–15.0)
Immature Granulocytes: 1 %
Lymphocytes Relative: 6 %
Lymphs Abs: 1.3 10*3/uL (ref 0.7–4.0)
MCH: 29.3 pg (ref 26.0–34.0)
MCHC: 30.8 g/dL (ref 30.0–36.0)
MCV: 95.1 fL (ref 80.0–100.0)
Monocytes Absolute: 0.3 10*3/uL (ref 0.1–1.0)
Monocytes Relative: 2 %
Neutro Abs: 17.8 10*3/uL — ABNORMAL HIGH (ref 1.7–7.7)
Neutrophils Relative %: 91 %
Platelets: 282 10*3/uL (ref 150–400)
RBC: 4.71 MIL/uL (ref 3.87–5.11)
RDW: 14.1 % (ref 11.5–15.5)
WBC: 19.5 10*3/uL — ABNORMAL HIGH (ref 4.0–10.5)
nRBC: 0 % (ref 0.0–0.2)

## 2022-08-27 LAB — BASIC METABOLIC PANEL
Anion gap: 8 (ref 5–15)
BUN: 23 mg/dL (ref 8–23)
CO2: 26 mmol/L (ref 22–32)
Calcium: 9 mg/dL (ref 8.9–10.3)
Chloride: 108 mmol/L (ref 98–111)
Creatinine, Ser: 0.68 mg/dL (ref 0.44–1.00)
GFR, Estimated: >60 mL/min (ref >60)
Glucose, Bld: 145 mg/dL — ABNORMAL HIGH (ref 70–99)
Potassium: 5.1 mmol/L (ref 3.5–5.1)
Sodium: 142 mmol/L (ref 135–145)

## 2022-08-27 LAB — BRAIN NATRIURETIC PEPTIDE: B Natriuretic Peptide: 243.5 pg/mL — ABNORMAL HIGH (ref 0.0–100.0)

## 2022-08-27 MED ORDER — FLUTICASONE PROPIONATE 50 MCG/ACT NA SUSP
2.0000 | Freq: Every day | NASAL | Status: DC
  Administered 2022-08-27 – 2022-08-29 (×3): 2 via NASAL
  Filled 2022-08-27: qty 16

## 2022-08-27 MED ORDER — METHYLPREDNISOLONE SODIUM SUCC 125 MG IJ SOLR
60.0000 mg | Freq: Two times a day (BID) | INTRAMUSCULAR | Status: DC
  Administered 2022-08-27: 60 mg via INTRAVENOUS
  Filled 2022-08-27: qty 2

## 2022-08-27 MED ORDER — IPRATROPIUM-ALBUTEROL 0.5-2.5 (3) MG/3ML IN SOLN
3.0000 mL | Freq: Three times a day (TID) | RESPIRATORY_TRACT | Status: DC
  Administered 2022-08-28 – 2022-08-29 (×4): 3 mL via RESPIRATORY_TRACT
  Filled 2022-08-27 (×5): qty 3

## 2022-08-27 MED ORDER — NICOTINE POLACRILEX 2 MG MT GUM: 2.0000 mg | CHEWING_GUM | OROMUCOSAL | Status: DC | PRN

## 2022-08-27 MED ORDER — PANTOPRAZOLE SODIUM 40 MG PO TBEC
40.0000 mg | DELAYED_RELEASE_TABLET | Freq: Every day | ORAL | Status: DC
  Administered 2022-08-27 – 2022-08-29 (×3): 40 mg via ORAL
  Filled 2022-08-27 (×3): qty 1

## 2022-08-27 MED ORDER — BUDESONIDE 0.5 MG/2ML IN SUSP
0.5000 mg | Freq: Two times a day (BID) | RESPIRATORY_TRACT | Status: DC
  Administered 2022-08-27 – 2022-08-29 (×5): 0.5 mg via RESPIRATORY_TRACT
  Filled 2022-08-27 (×5): qty 2

## 2022-08-27 MED ORDER — DOXYCYCLINE HYCLATE 100 MG PO TABS
100.0000 mg | ORAL_TABLET | Freq: Two times a day (BID) | ORAL | Status: DC
  Administered 2022-08-27 – 2022-08-29 (×4): 100 mg via ORAL
  Filled 2022-08-27 (×4): qty 1

## 2022-08-27 NOTE — Progress Notes (Signed)
Mobility Specialist - Progress Note   08/27/22 1202  Mobility  Activity Transferred from bed to chair  Level of Assistance Independent after set-up  Assistive Device None  Distance Ambulated (ft) 5 ft  Activity Response Tolerated fair  $Mobility charge 1 Mobility   Pt received in bed and agreed for mobility. Transferred to chair and did leg exercises. Pt left in chair with all needs met and family in room.   SpO2 RA: 91% EOB SpO2 RA: 77% standing SpO2 HFNC 6L: 91% chair  Roderick Pee Mobility Specialist

## 2022-08-27 NOTE — Progress Notes (Addendum)
PROGRESS NOTE    Grace Nelson  AJG:[id number] DOB: [birth year] DOA: 08/25/2022 PCP: [name], MD    Chief Complaint  Patient presents with   Shortness of Breath    Brief Narrative:  Grace Nelson is a 71 y.o. female with medical history significant for chronic tobacco abuse, allergic rhinitis, hyperlipidemia, who is admitted to [facility] on 08/25/2022 with acute hypoxic respiratory failure in the setting of suspected new diagnosis of acute COPD exacerbation . COVID-19/flu PCR negative. CTA chest with PE protocol showed no evidence of acute pulmonary embolism, while showing evidence of bibasilar atelectasis, in the absence of any evidence of overt infiltrate, edema, effusion, or pneumothorax.   Assessment & Plan:   Principal Problem:   Acute exacerbation of chronic obstructive pulmonary disease (COPD) ([place]) Active Problems:   Acute respiratory failure with hypoxia (HCC)   Hypokalemia   Generalized weakness   SIRS (systemic inflammatory response syndrome) (HCC)   Tobacco abuse   Allergic rhinitis   HLD (hyperlipidemia)   COPD with acute exacerbation (HCC)  #1 acute respiratory failure with hypoxia secondary to acute COPD exacerbation -Patient presenting with a 1 week history of progressive shortness of breath with associated new onset nonproductive cough as well as new onset wheezing.  Denies any orthopnea, no PND, no lower extremity edema.  No calf tenderness. -Patient with ongoing chronic tobacco history with at least a 45-pack-year history. -Patient denied any prior history of underlying COPD. -Patient currently requiring 5 L O2. -CT chest negative for PE, acute infiltrate, no volume overload. -Patient improving clinically with current treatment for acute COPD exacerbation. -Decrease Solu-Medrol to 60 mg IV every 12 hours and transition to oral prednisone taper tomorrow. -Change IV doxycycline to oral doxycycline to complete a 5 to 7-day course of  treatment. -Continue DuoNebs, Claritin. -Placed on Pulmicort, Flonase, PPI. -Check a BNP, 2D echo. -We will likely need home O2 on discharge and will need outpatient follow-up with pulmonary for formal PFTs and further management. -Tobacco cessation stressed to patient.  2.  Hypokalemia -Repleted.  3.  Leukocytosis -Likely steroid-induced. -Procalcitonin is negative.  Patient afebrile. -Follow-up.  4.  Tobacco abuse -Tobacco cessation. -Patient states unable to take nicotine patch due to probable contact dermatitis. -Placed on nicotine gum.  5.  SIRS, ruled out.  6.  Generalized weakness -TSH within normal limits. -Patient seen by PT.    DVT prophylaxis: Lovenox Code Status: Full Family Communication: Updated patient, husband at bedside. Disposition: Likely home if continued clinical improvement with home O2 in the next 24 hours.  Status is: Inpatient Remains inpatient appropriate because: Severity of illness   Consultants:  None  Procedures:  - CT angiogram chest 08/25/2022 -Chest x-ray 08/25/2022   Antimicrobials:  IV doxycycline 08/25/2022>>> 08/27/2022 Oral doxycycline 08/27/2022>>>>   Subjective: Sitting up in bed.  Husband at bedside.  No chest pain.  Shortness of breath, productive cough has improved significantly since admission.  No shortness of breath.  Objective: Vitals:   08/27/22 0212 08/27/22 0420 08/27/22 0847 08/27/22 0852  BP:  129/72    Pulse:  60    Resp:  18    Temp:  97.9 F (36.6 C)    TempSrc:  Oral    SpO2: 94% 98% 93% 93%  Weight:  80.5 kg    Height:        Intake/Output Summary (Last 24 hours) at 08/27/2022 1156 Last data filed at 08/27/2022 0945 Gross per 24 hour  Intake 980.39 ml  Output --  Net 980.39 ml   Filed Weights   08/25/22 1557 08/27/22 0420  Weight: 77.1 kg 80.5 kg    Examination:  General exam: Appears calm and comfortable  Respiratory system: Fair air movement, no significant wheezing noted, no crackles,  speaking in full sentences.  No use of accessory muscles of respiration.   Cardiovascular system: S1 & S2 heard, RRR. No JVD, murmurs, rubs, gallops or clicks. No pedal edema. Gastrointestinal system: Abdomen is nondistended, soft and nontender. No organomegaly or masses felt. Normal bowel sounds heard. Central nervous system: Alert and oriented. No focal neurological deficits. Extremities: Symmetric 5 x 5 power. Skin: No rashes, lesions or ulcers Psychiatry: Judgement and insight appear normal. Mood & affect appropriate.     Data Reviewed: I have personally reviewed following labs and imaging studies  CBC: Recent Labs  Lab 08/25/22 1608 08/26/22 0600 08/27/22 0546  WBC 12.6* 10.8* 19.5*  NEUTROABS  --  9.3* 17.8*  HGB 14.4 13.8 13.8  HCT 45.7 43.2 44.8  MCV 93.3 91.9 95.1  PLT 237 255 976    Basic Metabolic Panel: Recent Labs  Lab 08/25/22 1608 08/26/22 0600 08/27/22 0546  NA 142 142 142  K 2.9* 4.9 5.1  CL 110 108 108  CO2 '23 26 26  '$ GLUCOSE 107* 142* 145*  BUN '14 15 23  '$ CREATININE 0.49 0.47 0.68  CALCIUM 7.4* 9.3 9.0  MG  --  1.7  --   PHOS  --  3.5  --     GFR: Estimated Creatinine Clearance: 61.6 mL/min (by C-G formula based on SCr of 0.68 mg/dL).  Liver Function Tests: Recent Labs  Lab 08/25/22 1608 08/26/22 0600  AST 15 12*  ALT 9 10  ALKPHOS 42 48  BILITOT 0.6 0.3  PROT 5.9* 6.5  ALBUMIN 3.0* 3.4*    CBG: No results for input(s): "GLUCAP" in the last 168 hours.   Recent Results (from the past 240 hour(s))  Resp Panel by RT-PCR (Flu A&B, Covid) Anterior Nasal Swab     Status: None   Collection Time: 08/25/22  4:08 PM   Specimen: Anterior Nasal Swab  Result Value Ref Range Status   SARS Coronavirus 2 by RT PCR NEGATIVE NEGATIVE Final    Comment: (NOTE) SARS-CoV-2 target nucleic acids are NOT DETECTED.  The SARS-CoV-2 RNA is generally detectable in upper respiratory specimens during the acute phase of infection. The lowest concentration  of SARS-CoV-2 viral copies this assay can detect is 138 copies/mL. A negative result does not preclude SARS-Cov-2 infection and should not be used as the sole basis for treatment or other patient management decisions. A negative result may occur with  improper specimen collection/handling, submission of specimen other than nasopharyngeal swab, presence of viral mutation(s) within the areas targeted by this assay, and inadequate number of viral copies(<138 copies/mL). A negative result must be combined with clinical observations, patient history, and epidemiological information. The expected result is Negative.  Fact Sheet for Patients:  EntrepreneurPulse.com.au  Fact Sheet for Healthcare Providers:  IncredibleEmployment.be  This test is no t yet approved or cleared by the [place] FDA and  has been authorized for detection and/or diagnosis of SARS-CoV-2 by FDA under an Emergency Use Authorization (EUA). This EUA will remain  in effect (meaning this test can be used) for the duration of the COVID-19 declaration under Section 564(b)(1) of the Act, 21 U.S.C.section 360bbb-3(b)(1), unless the authorization is terminated  or revoked sooner.       Influenza A by PCR  NEGATIVE NEGATIVE Final   Influenza B by PCR NEGATIVE NEGATIVE Final    Comment: (NOTE) The Xpert Xpress SARS-CoV-2/FLU/RSV plus assay is intended as an aid in the diagnosis of influenza from Nasopharyngeal swab specimens and should not be used as a sole basis for treatment. Nasal washings and aspirates are unacceptable for Xpert Xpress SARS-CoV-2/FLU/RSV testing.  Fact Sheet for Patients: EntrepreneurPulse.com.au  Fact Sheet for Healthcare Providers: IncredibleEmployment.be  This test is not yet approved or cleared by the [place] FDA and has been authorized for detection and/or diagnosis of SARS-CoV-2 by FDA under an Emergency Use  Authorization (EUA). This EUA will remain in effect (meaning this test can be used) for the duration of the COVID-19 declaration under Section 564(b)(1) of the Act, 21 U.S.C. section 360bbb-3(b)(1), unless the authorization is terminated or revoked.  Performed at [facility], [place] [address], [place], [place] [address]          Radiology Studies: CT Angio Chest PE W and/or Wo Contrast  Result Date: 08/25/2022 CLINICAL DATA:  High probability for PE, cough and shortness of breath. EXAM: CT ANGIOGRAPHY CHEST WITH CONTRAST TECHNIQUE: Multidetector CT imaging of the chest was performed using the standard protocol during bolus administration of intravenous contrast. Multiplanar CT image reconstructions and MIPs were obtained to evaluate the vascular anatomy. RADIATION DOSE REDUCTION: This exam was performed according to the departmental dose-optimization program which includes automated exposure control, adjustment of the mA and/or kV according to patient size and/or use of iterative reconstruction technique. CONTRAST:  11m OMNIPAQUE IOHEXOL 350 MG/ML SOLN COMPARISON:  CT of the chest 03/25/2022 FINDINGS: Cardiovascular: Satisfactory opacification of the pulmonary arteries to the segmental level. No evidence of pulmonary embolism. Normal heart size. No pericardial effusion. There are atherosclerotic calcifications of the aorta. Mediastinum/Nodes: No enlarged mediastinal, hilar, or axillary lymph nodes. Thyroid gland, trachea, and esophagus demonstrate no significant findings. Small hiatal hernia is present. Lungs/Pleura: Mild emphysematous changes are again seen. There is atelectasis in the bilateral lower lobes/lung bases and minimally in the right upper lobe. Lungs are otherwise clear. Upper Abdomen: No acute abnormality. Musculoskeletal: No chest wall abnormality. No acute or significant osseous findings. Review of the MIP images confirms the above findings. IMPRESSION: 1. No  evidence for pulmonary embolism. 2. Small amount of bilateral lower lobe atelectasis. 3. Aortic Atherosclerosis (ICD10-I70.0) and Emphysema (ICD10-J43.9). Electronically Signed   By: ARonney AstersM.D.   On: 08/25/2022 19:41   DG Chest Port 1 View  Result Date: 08/25/2022 CLINICAL DATA:  Dyspnea. Cough and shortness of breath for 7 days. Wheezing. EXAM: PORTABLE CHEST 1 VIEW COMPARISON:  03/25/2022 FINDINGS: Normal heart size. Aortic atherosclerotic calcifications. Lungs appear hyperinflated with mild diffuse coarsened interstitial markings. Scar versus platelike atelectasis noted within both lung bases. No pleural effusion or airspace consolidation. No signs of pneumothorax. IMPRESSION: 1. Bibasilar scarring versus atelectasis. 2. Emphysema. 3. Aortic atherosclerotic disease. Electronically Signed   By: TKerby MoorsM.D.   On: 08/25/2022 16:44        Scheduled Meds:  budesonide (PULMICORT) nebulizer solution  0.5 mg Nebulization BID   enoxaparin (LOVENOX) injection  40 mg Subcutaneous Q24H   fluticasone  2 spray Each Nare Daily   ipratropium-albuterol  3 mL Nebulization Q6H   loratadine  10 mg Oral Daily   methylPREDNISolone (SOLU-MEDROL) injection  80 mg Intravenous Q12H   pantoprazole  40 mg Oral Q0600   simvastatin  20 mg Oral QHS   Continuous Infusions:  doxycycline (VIBRAMYCIN) IV  100 mg (08/27/22 1057)     LOS: 1 day    Time spent: 35 minutes    Irine Seal, MD [facility]   To contact the attending provider between 7A-7P or the covering provider during after hours 7P-7A, please log into the web site [email or web address] and access using universal [place] password for that web site. If you do not have the password, please call the hospital operator.  08/27/2022, 11:56 AM

## 2022-08-27 NOTE — TOC Initial Note (Signed)
Transition of Care [facility]) - Initial/Assessment Note    Patient Details  Name: Grace Nelson MRN: [id number] Date of Birth: 12-26-1950  [facility] of [facility]) CM/SW Contact:    Leeroy Cha, RN Phone Number: 08/27/2022, 8:37 AM  Clinical Narrative:                  Transition of Care [facility]) Screening Note   Patient Details  Name: Grace Nelson Date of Birth: 1951/04/30   Transition of Care [facility]) CM/SW Contact:    Leeroy Cha, RN Phone Number: 08/27/2022, 8:37 AM    Transition of Care Department [facility]) has reviewed patient and no TOC needs have been identified at this time. We will continue to monitor patient advancement through interdisciplinary progression rounds. If new patient transition needs arise, please place a TOC consult.    Expected Discharge Plan: Home/Self Care Barriers to Discharge: Continued Medical Work up   Patient Goals and CMS Choice Patient states their goals for this hospitalization and ongoing recovery are:: to go home CMS Medicare.gov Compare Post Acute Care list provided to:: Patient Choice offered to / list presented to : Patient  Expected Discharge Plan and Services Expected Discharge Plan: Home/Self Care   Discharge Planning Services: CM Consult   Living arrangements for the past 2 months: Single Family Home                                      Prior Living Arrangements/Services Living arrangements for the past 2 months: Single Family Home Lives with:: Self Patient language and need for interpreter reviewed:: Yes Do you feel safe going back to the place where you live?: Yes            Criminal Activity/Legal Involvement Pertinent to Current Situation/Hospitalization: No - Comment as needed  Activities of Daily Living Home Assistive Devices/Equipment: Eyeglasses ADL Screening (condition at time of admission) Patient's cognitive ability adequate to safely complete daily activities?: Yes Is the  patient deaf or have difficulty hearing?: No Does the patient have difficulty seeing, even when wearing glasses/contacts?: No Does the patient have difficulty concentrating, remembering, or making decisions?: No Patient able to express need for assistance with ADLs?: Yes Does the patient have difficulty dressing or bathing?: No Independently performs ADLs?: Yes (appropriate for developmental age) Does the patient have difficulty walking or climbing stairs?: Yes (gets sob) Weakness of Legs: Both Weakness of Arms/Hands: Both  Permission Sought/Granted                  Emotional Assessment Appearance:: Appears stated age Attitude/Demeanor/Rapport: Engaged Affect (typically observed): Calm Orientation: : Oriented to Self, Oriented to Place, Oriented to  Time, Oriented to Situation Alcohol / Substance Use: Tobacco Use Psych Involvement: No (comment)  Admission diagnosis:  Hypokalemia [E87.6] Acute exacerbation of chronic obstructive pulmonary disease (COPD) ([place]) [J44.1] COPD exacerbation ([place]) [J44.1] COPD with acute exacerbation ([place]) [J44.1] Patient Active Problem List   Diagnosis Date Noted   Acute respiratory failure with hypoxia ([place]) 08/26/2022   Hypokalemia 08/26/2022   Generalized weakness 08/26/2022   SIRS (systemic inflammatory response syndrome) ([place]) 08/26/2022   Tobacco abuse 08/26/2022   Allergic rhinitis 08/26/2022   HLD (hyperlipidemia) 08/26/2022   COPD with acute exacerbation ([place]) 08/26/2022   Acute exacerbation of chronic obstructive pulmonary disease (COPD) ([place]) 08/25/2022   Peripheral vascular disease, unspecified ([place]) 08/28/2014   PCP:  [name], MD  Pharmacy:   [id number] - [address] [place] [address] Phone: [phone] Fax: [phone]     Social Determinants of Health (SDOH) Interventions    Readmission Risk Interventions   No data to display

## 2022-08-27 NOTE — Plan of Care (Signed)

## 2022-08-28 ENCOUNTER — Inpatient Hospital Stay (HOSPITAL_COMMUNITY): Payer: Medicare HMO

## 2022-08-28 DIAGNOSIS — J441 Chronic obstructive pulmonary disease with (acute) exacerbation: Secondary | ICD-10-CM | POA: Diagnosis not present

## 2022-08-28 DIAGNOSIS — R531 Weakness: Secondary | ICD-10-CM | POA: Diagnosis not present

## 2022-08-28 DIAGNOSIS — J9601 Acute respiratory failure with hypoxia: Secondary | ICD-10-CM | POA: Diagnosis not present

## 2022-08-28 DIAGNOSIS — R0602 Shortness of breath: Secondary | ICD-10-CM | POA: Diagnosis not present

## 2022-08-28 DIAGNOSIS — J309 Allergic rhinitis, unspecified: Secondary | ICD-10-CM | POA: Diagnosis not present

## 2022-08-28 LAB — ECHOCARDIOGRAM COMPLETE
Area-P 1/2: 3.68 cm2
Calc EF: 72 %
Height: 62 in
S' Lateral: 2.9 cm
Single Plane A2C EF: 72.3 %
Single Plane A4C EF: 73.4 %
Weight: 2843.05 oz

## 2022-08-28 LAB — CBC WITH DIFFERENTIAL/PLATELET
Abs Immature Granulocytes: 0.17 10*3/uL — ABNORMAL HIGH (ref 0.00–0.07)
Basophils Absolute: 0 10*3/uL (ref 0.0–0.1)
Basophils Relative: 0 %
Eosinophils Absolute: 0 10*3/uL (ref 0.0–0.5)
Eosinophils Relative: 0 %
HCT: 45.6 % (ref 36.0–46.0)
Hemoglobin: 14.1 g/dL (ref 12.0–15.0)
Immature Granulocytes: 1 %
Lymphocytes Relative: 6 %
Lymphs Abs: 1.2 10*3/uL (ref 0.7–4.0)
MCH: 29 pg (ref 26.0–34.0)
MCHC: 30.9 g/dL (ref 30.0–36.0)
MCV: 93.6 fL (ref 80.0–100.0)
Monocytes Absolute: 0.5 10*3/uL (ref 0.1–1.0)
Monocytes Relative: 2 %
Neutro Abs: 17.9 10*3/uL — ABNORMAL HIGH (ref 1.7–7.7)
Neutrophils Relative %: 91 %
Platelets: 307 10*3/uL (ref 150–400)
RBC: 4.87 MIL/uL (ref 3.87–5.11)
RDW: 14.1 % (ref 11.5–15.5)
WBC: 19.7 10*3/uL — ABNORMAL HIGH (ref 4.0–10.5)
nRBC: 0 % (ref 0.0–0.2)

## 2022-08-28 LAB — BASIC METABOLIC PANEL
Anion gap: 7 (ref 5–15)
BUN: 23 mg/dL (ref 8–23)
CO2: 28 mmol/L (ref 22–32)
Calcium: 9.2 mg/dL (ref 8.9–10.3)
Chloride: 106 mmol/L (ref 98–111)
Creatinine, Ser: 0.61 mg/dL (ref 0.44–1.00)
GFR, Estimated: >60 mL/min (ref >60)
Glucose, Bld: 137 mg/dL — ABNORMAL HIGH (ref 70–99)
Potassium: 4.6 mmol/L (ref 3.5–5.1)
Sodium: 141 mmol/L (ref 135–145)

## 2022-08-28 LAB — MAGNESIUM: Magnesium: 1.8 mg/dL (ref 1.7–2.4)

## 2022-08-28 LAB — BRAIN NATRIURETIC PEPTIDE: B Natriuretic Peptide: 272.3 pg/mL — ABNORMAL HIGH (ref 0.0–100.0)

## 2022-08-28 MED ORDER — PREDNISONE 20 MG PO TABS
60.0000 mg | ORAL_TABLET | Freq: Every day | ORAL | Status: DC
  Administered 2022-08-28 – 2022-08-29 (×2): 60 mg via ORAL
  Filled 2022-08-28 (×2): qty 3

## 2022-08-28 MED ORDER — FUROSEMIDE 10 MG/ML IJ SOLN
40.0000 mg | Freq: Every day | INTRAMUSCULAR | Status: DC
  Administered 2022-08-28: 40 mg via INTRAVENOUS
  Filled 2022-08-28 (×2): qty 4

## 2022-08-28 NOTE — Progress Notes (Signed)
  Echocardiogram 2D Echocardiogram has been performed.  Grace Nelson 08/28/2022, 4:01 PM

## 2022-08-28 NOTE — Progress Notes (Signed)
PROGRESS NOTE    Grace Nelson  PYP:[id number] DOB: Feb 18, 1951 DOA: 08/25/2022 PCP: [name], MD    Chief Complaint  Patient presents with   Shortness of Breath    Brief Narrative:  Grace Nelson is a 71 y.o. female with medical history significant for chronic tobacco abuse, allergic rhinitis, hyperlipidemia, who is admitted to [facility] on 08/25/2022 with acute hypoxic respiratory failure in the setting of suspected new diagnosis of acute COPD exacerbation . COVID-19/flu PCR negative. CTA chest with PE protocol showed no evidence of acute pulmonary embolism, while showing evidence of bibasilar atelectasis, in the absence of any evidence of overt infiltrate, edema, effusion, or pneumothorax.   Assessment & Plan:   Principal Problem:   Acute exacerbation of chronic obstructive pulmonary disease (COPD) ([place]) Active Problems:   Acute respiratory failure with hypoxia (HCC)   Hypokalemia   Generalized weakness   SIRS (systemic inflammatory response syndrome) (HCC)   Tobacco abuse   Allergic rhinitis   HLD (hyperlipidemia)   COPD with acute exacerbation (HCC)  #1 acute respiratory failure with hypoxia felt likely secondary to acute COPD exacerbation -Patient presenting with a 1 week history of progressive shortness of breath with associated new onset nonproductive cough as well as new onset wheezing.  Denies any orthopnea, no PND, no lower extremity edema.  No calf tenderness. -Patient with ongoing chronic tobacco history with at least a 45-pack-year history. -Patient denied any prior history of underlying COPD. -Patient currently requiring 6 L O2. -Noted to have sats in the 70s on room air on ambulation. -CT chest negative for PE, acute infiltrate, no volume overload. -Patient improving clinically with current treatment for acute COPD exacerbation. -Concern for possible volume overload as patient with significant O2 requirements at 6 L and was noted on  home O2 prior to admission. -Repeat chest x-ray, check it 2D echo. -BNP noted to be elevated at 272.3 this morning. -Transition from IV Solu-Medrol to oral prednisone 60 mg daily x3 days and then taper down.   -IV doxycycline has been changed to oral doxycycline to complete a 5 to 7-day course of treatment.  -Continue DuoNebs, Claritin, Pulmicort, Flonase, PPI. -We will give Lasix 40 mg IV every 12 hours x2 doses. -Strict I's and O's, daily weights. -If 2D echo is abnormal will need cardiology input. -Will likely need home O2 on discharge and will need outpatient follow-up with pulmonary for formal PFTs and further management. -Tobacco cessation stressed to patient.  2.  Hypokalemia -Repleted. -Potassium of 4.6.  3.  Leukocytosis -Likely steroid-induced. -Procalcitonin is negative.  Patient afebrile. -Repeat labs in the AM.  4.  Tobacco abuse -Tobacco cessation. -Patient states unable to take nicotine patch due to probable contact dermatitis. -Continue nicotine gum.    5.  SIRS, ruled out.  6.  Generalized weakness -TSH within normal limits. -Patient seen by PT.    DVT prophylaxis: Lovenox Code Status: Full Family Communication: Updated patient, husband at bedside. Disposition: Likely home if continued clinical improvement with home O2 in 2 to 3 days.  Status is: Inpatient Remains inpatient appropriate because: Severity of illness   Consultants:  None  Procedures:  - CT angiogram chest 08/25/2022 -Chest x-ray 08/25/2022, 08/28/2022 - 2D echo pending   Antimicrobials:  IV doxycycline 08/25/2022>>> 08/27/2022 Oral doxycycline 08/27/2022>>>>   Subjective: Sitting up in bed.  Just ambulated in the hallway noted to have sats in the 70s on room air and required 6 L to have sats approximately 92%.  Stated after ambulating in the hallway went to the bathroom and feels winded.  Denies any chest pain.  No abdominal pain.    Objective: Vitals:   08/28/22 0436 08/28/22 0515  08/28/22 0526 08/28/22 0725  BP: (!) 156/68     Pulse: 62     Resp: 18   (!) 22  Temp: 97.8 F (36.6 C)     TempSrc: Oral     SpO2: 91% (!) 89% 91% 94%  Weight:  80.6 kg    Height:        Intake/Output Summary (Last 24 hours) at 08/28/2022 1033 Last data filed at 08/27/2022 1320 Gross per 24 hour  Intake 240 ml  Output --  Net 240 ml    Filed Weights   08/25/22 1557 08/27/22 0420 08/28/22 0515  Weight: 77.1 kg 80.5 kg 80.6 kg    Examination:  General exam: Appears calm and comfortable  Respiratory system: Some bibasilar crackles.  Fair air movement.  No significant wheezing noted.  No use of accessory muscles of respiration.   Cardiovascular system: Regular rate rhythm no murmurs rubs or gallops.  No significant lower extremity edema.  Gastrointestinal system: Abdomen is soft, nontender, nondistended, positive bowel sounds.  No rebound.  No guarding.  Central nervous system: Alert and oriented. No focal neurological deficits. Extremities: Symmetric 5 x 5 power. Skin: No rashes, lesions or ulcers Psychiatry: Judgement and insight appear normal. Mood & affect appropriate.     Data Reviewed: I have personally reviewed following labs and imaging studies  CBC: Recent Labs  Lab 08/25/22 1608 08/26/22 0600 08/27/22 0546 08/28/22 0623  WBC 12.6* 10.8* 19.5* 19.7*  NEUTROABS  --  9.3* 17.8* 17.9*  HGB 14.4 13.8 13.8 14.1  HCT 45.7 43.2 44.8 45.6  MCV 93.3 91.9 95.1 93.6  PLT 237 255 282 307     Basic Metabolic Panel: Recent Labs  Lab 08/25/22 1608 08/26/22 0600 08/27/22 0546 08/28/22 0623  NA 142 142 142 141  K 2.9* 4.9 5.1 4.6  CL 110 108 108 106  CO2 '23 26 26 28  '$ GLUCOSE 107* 142* 145* 137*  BUN '14 15 23 23  '$ CREATININE 0.49 0.47 0.68 0.61  CALCIUM 7.4* 9.3 9.0 9.2  MG  --  1.7  --  1.8  PHOS  --  3.5  --   --      GFR: Estimated Creatinine Clearance: 61.6 mL/min (by C-G formula based on SCr of 0.61 mg/dL).  Liver Function Tests: Recent Labs  Lab  08/25/22 1608 08/26/22 0600  AST 15 12*  ALT 9 10  ALKPHOS 42 48  BILITOT 0.6 0.3  PROT 5.9* 6.5  ALBUMIN 3.0* 3.4*     CBG: No results for input(s): "GLUCAP" in the last 168 hours.   Recent Results (from the past 240 hour(s))  Resp Panel by RT-PCR (Flu A&B, Covid) Anterior Nasal Swab     Status: None   Collection Time: 08/25/22  4:08 PM   Specimen: Anterior Nasal Swab  Result Value Ref Range Status   SARS Coronavirus 2 by RT PCR NEGATIVE NEGATIVE Final    Comment: (NOTE) SARS-CoV-2 target nucleic acids are NOT DETECTED.  The SARS-CoV-2 RNA is generally detectable in upper respiratory specimens during the acute phase of infection. The lowest concentration of SARS-CoV-2 viral copies this assay can detect is 138 copies/mL. A negative result does not preclude SARS-Cov-2 infection and should not be used as the sole basis for treatment or other patient management decisions. A  negative result may occur with  improper specimen collection/handling, submission of specimen other than nasopharyngeal swab, presence of viral mutation(s) within the areas targeted by this assay, and inadequate number of viral copies(<138 copies/mL). A negative result must be combined with clinical observations, patient history, and epidemiological information. The expected result is Negative.  Fact Sheet for Patients:  EntrepreneurPulse.com.au  Fact Sheet for Healthcare Providers:  IncredibleEmployment.be  This test is no t yet approved or cleared by the [place] FDA and  has been authorized for detection and/or diagnosis of SARS-CoV-2 by FDA under an Emergency Use Authorization (EUA). This EUA will remain  in effect (meaning this test can be used) for the duration of the COVID-19 declaration under Section 564(b)(1) of the Act, 21 U.S.C.section 360bbb-3(b)(1), unless the authorization is terminated  or revoked sooner.       Influenza A by PCR NEGATIVE  NEGATIVE Final   Influenza B by PCR NEGATIVE NEGATIVE Final    Comment: (NOTE) The Xpert Xpress SARS-CoV-2/FLU/RSV plus assay is intended as an aid in the diagnosis of influenza from Nasopharyngeal swab specimens and should not be used as a sole basis for treatment. Nasal washings and aspirates are unacceptable for Xpert Xpress SARS-CoV-2/FLU/RSV testing.  Fact Sheet for Patients: EntrepreneurPulse.com.au  Fact Sheet for Healthcare Providers: IncredibleEmployment.be  This test is not yet approved or cleared by the [place] FDA and has been authorized for detection and/or diagnosis of SARS-CoV-2 by FDA under an Emergency Use Authorization (EUA). This EUA will remain in effect (meaning this test can be used) for the duration of the COVID-19 declaration under Section 564(b)(1) of the Act, 21 U.S.C. section 360bbb-3(b)(1), unless the authorization is terminated or revoked.  Performed at [facility], [place] [address] [place], [place] [id number]          Radiology Studies: No results found.      Scheduled Meds:  budesonide (PULMICORT) nebulizer solution  0.5 mg Nebulization BID   doxycycline  100 mg Oral Q12H   enoxaparin (LOVENOX) injection  40 mg Subcutaneous Q24H   fluticasone  2 spray Each Nare Daily   furosemide  40 mg Intravenous Daily   ipratropium-albuterol  3 mL Nebulization TID   loratadine  10 mg Oral Daily   pantoprazole  40 mg Oral Q0600   predniSONE  60 mg Oral QAC breakfast   simvastatin  20 mg Oral QHS   Continuous Infusions:     LOS: 2 days    Time spent: 35 minutes    Irine Seal, MD [facility]   To contact the attending provider between 7A-7P or the covering provider during after hours 7P-7A, please log into the web site [email or web address] and access using universal [place] password for that web site. If you do not have the password, please call the hospital  operator.  08/28/2022, 10:33 AM

## 2022-08-28 NOTE — Progress Notes (Signed)
Mobility Specialist - Progress Note   08/28/22 1030  Oxygen Therapy  O2 Flow Rate (L/min) 6 L/min  Mobility  Activity Ambulated with assistance in hallway  Level of Assistance Modified independent, requires aide device or extra time  Assistive Device None  Distance Ambulated (ft) 300 ft  Activity Response Tolerated fair  $Mobility charge 1 Mobility   Pt received in bed and agreed for mobility. No symptoms of low O2. Pt back to bed with all needs met and family in room. Ambulated with RN Wille Glaser.   EOB RA SpO2: 91% AMBULATING RA SpO2: 77% POST AMB. [place] SpO2: 91%  Addison Lank

## 2022-08-29 ENCOUNTER — Other Ambulatory Visit (HOSPITAL_COMMUNITY): Payer: Self-pay

## 2022-08-29 DIAGNOSIS — J309 Allergic rhinitis, unspecified: Secondary | ICD-10-CM | POA: Diagnosis not present

## 2022-08-29 DIAGNOSIS — R531 Weakness: Secondary | ICD-10-CM | POA: Diagnosis not present

## 2022-08-29 DIAGNOSIS — J9601 Acute respiratory failure with hypoxia: Secondary | ICD-10-CM | POA: Diagnosis not present

## 2022-08-29 DIAGNOSIS — J441 Chronic obstructive pulmonary disease with (acute) exacerbation: Secondary | ICD-10-CM | POA: Diagnosis not present

## 2022-08-29 LAB — CBC WITH DIFFERENTIAL/PLATELET
Abs Immature Granulocytes: 0.12 10*3/uL — ABNORMAL HIGH (ref 0.00–0.07)
Basophils Absolute: 0 10*3/uL (ref 0.0–0.1)
Basophils Relative: 0 %
Eosinophils Absolute: 0 10*3/uL (ref 0.0–0.5)
Eosinophils Relative: 0 %
HCT: 46.9 % — ABNORMAL HIGH (ref 36.0–46.0)
Hemoglobin: 15.1 g/dL — ABNORMAL HIGH (ref 12.0–15.0)
Immature Granulocytes: 1 %
Lymphocytes Relative: 20 %
Lymphs Abs: 3.4 10*3/uL (ref 0.7–4.0)
MCH: 29.6 pg (ref 26.0–34.0)
MCHC: 32.2 g/dL (ref 30.0–36.0)
MCV: 92 fL (ref 80.0–100.0)
Monocytes Absolute: 1.2 10*3/uL — ABNORMAL HIGH (ref 0.1–1.0)
Monocytes Relative: 7 %
Neutro Abs: 12.3 10*3/uL — ABNORMAL HIGH (ref 1.7–7.7)
Neutrophils Relative %: 72 %
Platelets: 322 10*3/uL (ref 150–400)
RBC: 5.1 MIL/uL (ref 3.87–5.11)
RDW: 14 % (ref 11.5–15.5)
WBC: 17 10*3/uL — ABNORMAL HIGH (ref 4.0–10.5)
nRBC: 0 % (ref 0.0–0.2)

## 2022-08-29 LAB — BASIC METABOLIC PANEL
Anion gap: 9 (ref 5–15)
BUN: 22 mg/dL (ref 8–23)
CO2: 32 mmol/L (ref 22–32)
Calcium: 8.9 mg/dL (ref 8.9–10.3)
Chloride: 100 mmol/L (ref 98–111)
Creatinine, Ser: 0.6 mg/dL (ref 0.44–1.00)
GFR, Estimated: >60 mL/min (ref >60)
Glucose, Bld: 85 mg/dL (ref 70–99)
Potassium: 3.5 mmol/L (ref 3.5–5.1)
Sodium: 141 mmol/L (ref 135–145)

## 2022-08-29 LAB — MAGNESIUM: Magnesium: 1.8 mg/dL (ref 1.7–2.4)

## 2022-08-29 MED ORDER — TIOTROPIUM BROMIDE MONOHYDRATE 18 MCG IN CAPS
18.0000 ug | ORAL_CAPSULE | Freq: Every day | RESPIRATORY_TRACT | 1 refills | Status: DC
  Filled 2022-08-29: qty 30, 30d supply, fill #0

## 2022-08-29 MED ORDER — FLUTICASONE PROPIONATE 50 MCG/ACT NA SUSP
2.0000 | Freq: Every day | NASAL | 0 refills | Status: DC
  Filled 2022-08-29: qty 16, 30d supply, fill #0

## 2022-08-29 MED ORDER — PREDNISONE 20 MG PO TABS
ORAL_TABLET | ORAL | 0 refills | Status: AC
  Filled 2022-08-29: qty 15, 8d supply, fill #0

## 2022-08-29 MED ORDER — POTASSIUM CHLORIDE CRYS ER 10 MEQ PO TBCR
20.0000 meq | EXTENDED_RELEASE_TABLET | Freq: Once | ORAL | Status: AC
  Administered 2022-08-29: 20 meq via ORAL
  Filled 2022-08-29: qty 2

## 2022-08-29 MED ORDER — PANTOPRAZOLE SODIUM 40 MG PO TBEC
40.0000 mg | DELAYED_RELEASE_TABLET | Freq: Every day | ORAL | 1 refills | Status: DC
  Filled 2022-08-29: qty 30, 30d supply, fill #0

## 2022-08-29 MED ORDER — ALBUTEROL SULFATE HFA 108 (90 BASE) MCG/ACT IN AERS
2.0000 | INHALATION_SPRAY | Freq: Four times a day (QID) | RESPIRATORY_TRACT | 2 refills | Status: DC | PRN
  Filled 2022-08-29: qty 13.4, 47d supply, fill #0

## 2022-08-29 MED ORDER — DOXYCYCLINE HYCLATE 100 MG PO TABS
100.0000 mg | ORAL_TABLET | Freq: Two times a day (BID) | ORAL | 0 refills | Status: AC
  Filled 2022-08-29: qty 4, 2d supply, fill #0

## 2022-08-29 MED ORDER — MOMETASONE FURO-FORMOTEROL FUM 200-5 MCG/ACT IN AERO
2.0000 | INHALATION_SPRAY | Freq: Two times a day (BID) | RESPIRATORY_TRACT | Status: DC
  Filled 2022-08-29: qty 8.8

## 2022-08-29 MED ORDER — MOMETASONE FURO-FORMOTEROL FUM 200-5 MCG/ACT IN AERO
2.0000 | INHALATION_SPRAY | Freq: Two times a day (BID) | RESPIRATORY_TRACT | 1 refills | Status: DC
  Filled 2022-08-29: qty 13, 30d supply, fill #0

## 2022-08-29 MED ORDER — NICOTINE POLACRILEX 2 MG MT GUM
2.0000 mg | CHEWING_GUM | OROMUCOSAL | 0 refills | Status: DC | PRN
  Filled 2022-08-29: qty 110, 30d supply, fill #0

## 2022-08-29 NOTE — Discharge Summary (Signed)
Physician Discharge Summary  Grace Nelson SJG:[id number] DOB: February 04, 1951 DOA: 08/25/2022  PCP: [name], MD  Admit date: 08/25/2022 Discharge date: 08/29/2022  Time spent: 60 minutes  Recommendations for Outpatient Follow-up:  Follow-up with [name], MD in 2 weeks.  On follow-up patient need a basic metabolic profile done to follow-up on electrolytes and renal function.  Patient need a CBC done to follow-up on leukocytosis.  Patient may benefit from outpatient referral to pulmonary for formal PFTs and further management of COPD.  Ambulatory referral made on discharge to pulmonary.  Patient will need ambulatory sats checked on follow-up to see if patient still needs ongoing home O2.   Discharge Diagnoses:  Principal Problem:   Acute exacerbation of chronic obstructive pulmonary disease (COPD) ([place]) Active Problems:   Acute respiratory failure with hypoxia (HCC)   Hypokalemia   Generalized weakness   SIRS (systemic inflammatory response syndrome) (HCC)   Tobacco abuse   Allergic rhinitis   HLD (hyperlipidemia)   COPD with acute exacerbation (HCC)   Discharge Condition: Stable and improved.  Diet recommendation: Heart healthy  Filed Weights   08/25/22 1557 08/27/22 0420 08/28/22 0515  Weight: 77.1 kg 80.5 kg 80.6 kg    History of present illness:  HPI per Dr.Howerter  Grace Nelson is a 71 y.o. female with medical history significant for chronic tobacco abuse, allergic rhinitis, hyperlipidemia, who is admitted to [facility] on 08/25/2022 with acute hypoxic respiratory failure in the setting of suspected new diagnosis of acute COPD exacerbation after presenting from home shortness of breath to [facility] ED complaining of shortness of breath.    The patient reports 1 week of progressive shortness of breath this associated new onset nonproductive cough as well as new onset wheezing.  Reports that her shortness of breath is not associate with any orthopnea, PND,  or new peripheral extremity edema.  She also denies any associated abscess, new calf tenderness, or new lower extremity erythema.  No recent chest pain, limitations, diaphoresis, nausea, vomiting, dizziness, PCP, or syncope.  She also denies any associated subjective fever, chills, rigors, generalized myalgias.  No recent trauma or travel.   She confirms a history of chronic tobacco abuse, noting that she is a current smoker, having smoked at least 1 pack/day over the last 45 years.  However, she denies any known history of underlying COPD.  She thinks that she may have undergone pulmonary function testing several years ago, but is not entirely sure about this, and does not recall the associated results of this potential study.  Denies any known baseline supplemental oxygen requirement.  Not on any scheduled or prn breathing treatments as an outpatient.   Over the last 3 to 4 days, she is also noted generalized weakness in the absence of any acute focal weakness.  Denies any recent dysuria or gross hematuria.   In the setting of progressive shortness of breath, she was seen by her PCP earlier today, at which time PCP noted new supplemental oxygen requirement, prompting PCP to recommend that the patient present to the emergency department for further evaluation and management thereof.     ED Course:  Vital signs in the ED were notable for the following: Afebrile; heart rate 77 and 91 systolic blood pressures in the 110s to 130s; respiratory rate 18-20, initial oxygen saturation 86% on room air, this is improving into the range of 93 and a 6% on 5 L nasal cannula.   Labs were notable for the following: CMP  notable for the following: Sodium 142, potassium 2.9,   23, creatinine 0.49, liver enzymes within normal limits.  BNP ordered, with result currently pending.  CBC notable for white blood cell count 4600, hemoglobin 14.4.  COVID-19/flu PCR negative.   Imaging and additional notable ED work-up: EKG  shows sinus rhythm with heart rate 84, normal intervals and no evidence of T wave or ST changes, including evidence of ST elevation.  CTA chest with PE protocol showed no evidence of acute pulmonary embolism, while showing evidence of bibasilar atelectasis, in the absence of any evidence of overt infiltrate, edema, effusion, or pneumothorax.   While in the ED, the following were administered: 2 nebulizer treatment, Solu-Medrol 125 mg IV x1, potassium chloride 40 mill colons p.o. x1 dose.   Subsequently, the patient was admitted for further evaluation and management of acute proximal respiratory failure in the setting of suspected new diagnosis of acute COPD exacerbation, with presenting labs also notable for hypokalemia.   Hospital Course:  #1 acute respiratory failure with hypoxia felt likely secondary to acute COPD exacerbation -Patient presented with a 1 week history of progressive shortness of breath with associated new onset nonproductive cough as well as new onset wheezing.  Denies any orthopnea, no PND, no lower extremity edema.  No calf tenderness. -Patient with ongoing chronic tobacco history with at least a 45-pack-year history. -Patient denied any prior history of underlying COPD. -Patient during the hospitalization was requiring up to 6 L oxygen.  -Patient not on O2 prior to admission.  -Noted to have sats in the 70s on room air on ambulation early on during the hospitalization. -CT chest negative for PE, acute infiltrate, no volume overload. -Patient improved clinically with current treatment for acute COPD exacerbation. -Concern for possible volume overload as patient with significant O2 requirements at 6 L and was noted on home O2 prior to admission. -Repeat chest x-ray done with no significant acute abnormalities. -2D echo was done which showed a EF of 60 to 65%, NWMA, normal left ventricular diastolic parameters, no valvular abnormalities.  -BNP noted to be elevated at 272.3 the  morning of 08/28/2022. -Patient during the hospitalization was on IV Solu-Medrol and subsequently transition to oral prednisone taper. -Patient also placed on IV doxycycline transition to oral doxycycline for discharge home on 2 more days of doxycycline to complete a 5-day course of treatment. -Patient also placed on scheduled nebulizers as well as Pulmicort, Claritin, Flonase, PPI. -Patient received a dose of Lasix 40 mg IV x1 on 08/28/2022 with a urine output of 2.2 L over the past 24 hours. -Patient improved clinically, ambulatory sats were done and patient will be discharged home on home O2 of 4 to 5 L nasal cannula with close outpatient follow-up with PCP. -Outpatient ambulatory referral made for pulmonary to be evaluated and to receive formal PFTs. -On follow-up with PCP patient will need ambulatory sats checked to see if patient needs ongoing oxygen. -Tobacco cessation stressed to patient. -Patient will be discharged in stable and improved condition.   2.  Hypokalemia -Repleted. -Outpatient follow-up.   3.  Leukocytosis -Likely steroid-induced. -Procalcitonin is negative.  Patient afebrile. -Leukocytosis was trending down with steroid taper. -Outpatient follow-up with PCP.   4.  Tobacco abuse -Tobacco cessation. -Patient states unable to take nicotine patch due to probable contact dermatitis. -Patient subsequently placed on nicotine gum.     5.  SIRS, ruled out.   6.  Generalized weakness -TSH within normal limits. -Patient seen by PT during the  hospitalization. -Patient improved clinically. -No home therapies recommended by PT.    Procedures: - CT angiogram chest 08/25/2022 -Chest x-ray 08/25/2022, 08/28/2022 - 2D echo 08/28/2022  Consultations: None  Discharge Exam: Vitals:   08/29/22 0924 08/29/22 1244  BP:  (!) 142/70  Pulse:  73  Resp:  20  Temp:  98.1 F (36.7 C)  SpO2: 94% 94%    General: NAD Cardiovascular: Regular rate rhythm no murmurs rubs or  gallops.  No JVD.  No lower extremity edema. Respiratory: Clear to auscultation bilaterally.  No wheezes, no crackles, no rhonchi.  Fair air movement.  Speaking in full sentences.  Discharge Instructions   Discharge Instructions     Ambulatory referral to Pulmonology   Complete by: As directed    Reason for referral: Asthma/COPD   Diet - low sodium heart healthy   Complete by: As directed    Increase activity slowly   Complete by: As directed       Allergies as of 08/29/2022       Reactions   Nsaids    Sulfa Antibiotics Hives        Medication List     TAKE these medications    acetaminophen 650 MG CR tablet Commonly known as: TYLENOL Take 650 mg by mouth every 8 (eight) hours as needed for pain.   albuterol 108 (90 Base) MCG/ACT inhaler Commonly known as: VENTOLIN HFA Inhale 2 puffs into the lungs every 6 (six) hours as needed for wheezing or shortness of breath. Use 3 times daily x 4 days then every 6 hours as needed.   cetirizine 10 MG tablet Commonly known as: ZYRTEC Take 10 mg by mouth daily.   doxycycline 100 MG tablet Commonly known as: VIBRA-TABS Take 1 tablet (100 mg total) by mouth every 12 (twelve) hours for 2 days.   fluticasone 50 MCG/ACT nasal spray Commonly known as: FLONASE Place 2 sprays into both nostrils daily. Start taking on: August 30, 2022   mometasone-formoterol 200-5 MCG/ACT Aero Commonly known as: DULERA Inhale 2 puffs into the lungs 2 (two) times daily.   nicotine polacrilex 2 MG gum Commonly known as: NICORETTE Take 1 each (2 mg total) by mouth as needed for smoking cessation.   pantoprazole 40 MG tablet Commonly known as: PROTONIX Take 1 tablet (40 mg total) by mouth daily at 6 (six) AM. Start taking on: August 30, 2022   predniSONE 20 MG tablet Commonly known as: DELTASONE Take 3 tablets (60 mg total) by mouth daily before breakfast for 2 days, THEN 2 tablets (40 mg total) daily before breakfast for 3 days, THEN 1  tablet (20 mg total) daily before breakfast for 3 days. Start taking on: August 30, 2022   simvastatin 20 MG tablet Commonly known as: ZOCOR Take 20 mg by mouth at bedtime.   tiotropium 18 MCG inhalation capsule Commonly known as: Spiriva HandiHaler Place 1 capsule (18 mcg total) into inhaler and inhale daily.   VITAMIN D (CHOLECALCIFEROL) PO Take 1 tablet by mouth daily.               Durable Medical Equipment  (From admission, onward)           Start     Ordered   08/29/22 1202  For home use only DME oxygen  Once       Question Answer Comment  Length of Need 6 Months   Mode or (Route) Nasal cannula   Liters per Minute -1   Frequency Continuous (  stationary and portable oxygen unit needed)   Oxygen conserving device Yes   Oxygen delivery system Gas      08/29/22 1201           Allergies  Allergen Reactions   Nsaids    Sulfa Antibiotics Hives    Follow-up Information     [name], MD. Schedule an appointment as soon as possible for a visit in 2 week(s).   Specialty: Family Medicine Contact information: [place] [address] [phone]                  The results of significant diagnostics from this hospitalization (including imaging, microbiology, ancillary and laboratory) are listed below for reference.    Significant Diagnostic Studies: ECHOCARDIOGRAM COMPLETE  Result Date: 08/28/2022    ECHOCARDIOGRAM REPORT   Patient Name:   BIANCO CANGE Date of Exam: 08/28/2022 Medical Rec #:  [id number]            Height:       62.0 in Accession #:    [id number]           Weight:       177.7 lb Date of Birth:  05/24/1951            BSA:          1.818 m Patient Age:    71 years             BP:           156/68 mmHg Patient Gender: F                    HR:           62 bpm. Exam Location:  Inpatient Procedure: 2D Echo, 3D Echo, Cardiac Doppler and Color Doppler Indications:    R06.02 SOB  History:        Patient has no  prior history of Echocardiogram examinations.                 COPD, Signs/Symptoms:Dyspnea and Shortness of Breath; Risk                 Factors:Current Smoker and Dyslipidemia. Hypoxia.  Sonographer:    Roseanna Rainbow RDCS Referring Phys: [place]  Sonographer Comments: Image acquisition challenging due to patient body habitus. IMPRESSIONS  1. Left ventricular ejection fraction, by estimation, is 60 to 65%. The left ventricle has normal function. The left ventricle has no regional wall motion abnormalities. Left ventricular diastolic parameters were normal.  2. Right ventricular systolic function is normal. The right ventricular size is normal.  3. The mitral valve is grossly normal. No evidence of mitral valve regurgitation.  4. The aortic valve was not well visualized. Aortic valve regurgitation is not visualized.  5. The inferior vena cava is normal in size with greater than 50% respiratory variability, suggesting right atrial pressure of 3 mmHg. Conclusion(s)/Recommendation(s): Normal biventricular function without evidence of hemodynamically significant valvular heart disease. FINDINGS  Left Ventricle: Left ventricular ejection fraction, by estimation, is 60 to 65%. The left ventricle has normal function. The left ventricle has no regional wall motion abnormalities. The left ventricular internal cavity size was normal in size. There is  no left ventricular hypertrophy. Left ventricular diastolic parameters were normal. Right Ventricle: The right ventricular size is normal. Right ventricular systolic function is normal. Left Atrium: Left atrial size was normal in size. Right Atrium: Right atrial size was normal  in size. Pericardium: There is no evidence of pericardial effusion. Mitral Valve: The mitral valve is grossly normal. No evidence of mitral valve regurgitation. Tricuspid Valve: The tricuspid valve is normal in structure. Tricuspid valve regurgitation is not demonstrated. Aortic Valve: The  aortic valve was not well visualized. Aortic valve regurgitation is not visualized. Pulmonic Valve: The pulmonic valve was not well visualized. Pulmonic valve regurgitation is not visualized. Aorta: The aortic root and ascending aorta are structurally normal, with no evidence of dilitation. Venous: The inferior vena cava is normal in size with greater than 50% respiratory variability, suggesting right atrial pressure of 3 mmHg. IAS/Shunts: No atrial level shunt detected by color flow Doppler.  LEFT VENTRICLE PLAX 2D LVIDd:         4.20 cm     Diastology LVIDs:         2.90 cm     LV e' medial:    9.79 cm/s LV PW:         0.90 cm     LV E/e' medial:  7.0 LV IVS:        0.80 cm     LV e' lateral:   11.20 cm/s LVOT diam:     1.85 cm     LV E/e' lateral: 6.1 LV SV:         65 LV SV Index:   36 LVOT Area:     2.69 cm                             3D Volume EF: LV Volumes (MOD)           3D EF:        60 % LV vol d, MOD A2C: 53.7 ml LV EDV:       109 ml LV vol d, MOD A4C: 49.7 ml LV ESV:       44 ml LV vol s, MOD A2C: 14.9 ml LV SV:        65 ml LV vol s, MOD A4C: 13.2 ml LV SV MOD A2C:     38.8 ml LV SV MOD A4C:     49.7 ml LV SV MOD BP:      37.3 ml RIGHT VENTRICLE             IVC RV S prime:     12.50 cm/s  IVC diam: 1.60 cm TAPSE (M-mode): 2.1 cm LEFT ATRIUM             Index        RIGHT ATRIUM           Index LA diam:        2.70 cm 1.49 cm/m   RA Area:     10.50 cm LA Vol (A2C):   30.1 ml 16.56 ml/m  RA Volume:   19.30 ml  10.62 ml/m LA Vol (A4C):   20.6 ml 11.33 ml/m LA Biplane Vol: 25.0 ml 13.75 ml/m  AORTIC VALVE LVOT Vmax:   123.00 cm/s LVOT Vmean:  78.750 cm/s LVOT VTI:    0.242 m  AORTA Ao Root diam: 2.40 cm Ao Asc diam:  2.80 cm MITRAL VALVE MV Area (PHT): 3.68 cm    SHUNTS MV Decel Time: 206 msec    Systemic VTI:  0.24 m MV E velocity: 68.40 cm/s  Systemic Diam: 1.85 cm MV A velocity: 65.05 cm/s MV E/A ratio:  1.05 Engineer, maintenance (IT) by Stanton Kidney  Branch Signature Date/Time: 08/28/2022/4:11:17  PM    Final    DG Chest 2 View  Result Date: 08/28/2022 CLINICAL DATA:  Shortness of breath and cough, low oxygen saturation with movement, smoker EXAM: CHEST - 2 VIEW COMPARISON:  08/25/2022 FINDINGS: Normal heart size, mediastinal contours, and pulmonary vascularity. Atherosclerotic calcification aorta. Bibasilar atelectasis. Bronchitic and emphysematous changes consistent with COPD. No infiltrate, pleural effusion, or pneumothorax. Bones demineralized. IMPRESSION: COPD changes with bibasilar atelectasis. Aortic Atherosclerosis (ICD10-I70.0) and Emphysema (ICD10-J43.9). Electronically Signed   By: Lavonia Dana M.D.   On: 08/28/2022 11:37   CT Angio Chest PE W and/or Wo Contrast  Result Date: 08/25/2022 CLINICAL DATA:  High probability for PE, cough and shortness of breath. EXAM: CT ANGIOGRAPHY CHEST WITH CONTRAST TECHNIQUE: Multidetector CT imaging of the chest was performed using the standard protocol during bolus administration of intravenous contrast. Multiplanar CT image reconstructions and MIPs were obtained to evaluate the vascular anatomy. RADIATION DOSE REDUCTION: This exam was performed according to the departmental dose-optimization program which includes automated exposure control, adjustment of the mA and/or kV according to patient size and/or use of iterative reconstruction technique. CONTRAST:  20m OMNIPAQUE IOHEXOL 350 MG/ML SOLN COMPARISON:  CT of the chest 03/25/2022 FINDINGS: Cardiovascular: Satisfactory opacification of the pulmonary arteries to the segmental level. No evidence of pulmonary embolism. Normal heart size. No pericardial effusion. There are atherosclerotic calcifications of the aorta. Mediastinum/Nodes: No enlarged mediastinal, hilar, or axillary lymph nodes. Thyroid gland, trachea, and esophagus demonstrate no significant findings. Small hiatal hernia is present. Lungs/Pleura: Mild emphysematous changes are again seen. There is atelectasis in the bilateral lower lobes/lung  bases and minimally in the right upper lobe. Lungs are otherwise clear. Upper Abdomen: No acute abnormality. Musculoskeletal: No chest wall abnormality. No acute or significant osseous findings. Review of the MIP images confirms the above findings. IMPRESSION: 1. No evidence for pulmonary embolism. 2. Small amount of bilateral lower lobe atelectasis. 3. Aortic Atherosclerosis (ICD10-I70.0) and Emphysema (ICD10-J43.9). Electronically Signed   By: ARonney AstersM.D.   On: 08/25/2022 19:41   DG Chest Port 1 View  Result Date: 08/25/2022 CLINICAL DATA:  Dyspnea. Cough and shortness of breath for 7 days. Wheezing. EXAM: PORTABLE CHEST 1 VIEW COMPARISON:  03/25/2022 FINDINGS: Normal heart size. Aortic atherosclerotic calcifications. Lungs appear hyperinflated with mild diffuse coarsened interstitial markings. Scar versus platelike atelectasis noted within both lung bases. No pleural effusion or airspace consolidation. No signs of pneumothorax. IMPRESSION: 1. Bibasilar scarring versus atelectasis. 2. Emphysema. 3. Aortic atherosclerotic disease. Electronically Signed   By: TKerby MoorsM.D.   On: 08/25/2022 16:44    Microbiology: Recent Results (from the past 240 hour(s))  Resp Panel by RT-PCR (Flu A&B, Covid) Anterior Nasal Swab     Status: None   Collection Time: 08/25/22  4:08 PM   Specimen: Anterior Nasal Swab  Result Value Ref Range Status   SARS Coronavirus 2 by RT PCR NEGATIVE NEGATIVE Final    Comment: (NOTE) SARS-CoV-2 target nucleic acids are NOT DETECTED.  The SARS-CoV-2 RNA is generally detectable in upper respiratory specimens during the acute phase of infection. The lowest concentration of SARS-CoV-2 viral copies this assay can detect is 138 copies/mL. A negative result does not preclude SARS-Cov-2 infection and should not be used as the sole basis for treatment or other patient management decisions. A negative result may occur with  improper specimen collection/handling, submission of  specimen other than nasopharyngeal swab, presence of viral mutation(s) within the areas targeted by this  assay, and inadequate number of viral copies(<138 copies/mL). A negative result must be combined with clinical observations, patient history, and epidemiological information. The expected result is Negative.  Fact Sheet for Patients:  EntrepreneurPulse.com.au  Fact Sheet for Healthcare Providers:  IncredibleEmployment.be  This test is no t yet approved or cleared by the [place] FDA and  has been authorized for detection and/or diagnosis of SARS-CoV-2 by FDA under an Emergency Use Authorization (EUA). This EUA will remain  in effect (meaning this test can be used) for the duration of the COVID-19 declaration under Section 564(b)(1) of the Act, 21 U.S.C.section 360bbb-3(b)(1), unless the authorization is terminated  or revoked sooner.       Influenza A by PCR NEGATIVE NEGATIVE Final   Influenza B by PCR NEGATIVE NEGATIVE Final    Comment: (NOTE) The Xpert Xpress SARS-CoV-2/FLU/RSV plus assay is intended as an aid in the diagnosis of influenza from Nasopharyngeal swab specimens and should not be used as a sole basis for treatment. Nasal washings and aspirates are unacceptable for Xpert Xpress SARS-CoV-2/FLU/RSV testing.  Fact Sheet for Patients: EntrepreneurPulse.com.au  Fact Sheet for Healthcare Providers: IncredibleEmployment.be  This test is not yet approved or cleared by the [place] FDA and has been authorized for detection and/or diagnosis of SARS-CoV-2 by FDA under an Emergency Use Authorization (EUA). This EUA will remain in effect (meaning this test can be used) for the duration of the COVID-19 declaration under Section 564(b)(1) of the Act, 21 U.S.C. section 360bbb-3(b)(1), unless the authorization is terminated or revoked.  Performed at [facility], [place]  [address], [place], [place] [address]      Labs: Basic Metabolic Panel: Recent Labs  Lab 08/25/22 1608 08/26/22 0600 08/27/22 0546 08/28/22 0623 08/29/22 0708  NA 142 142 142 141 141  K 2.9* 4.9 5.1 4.6 3.5  CL 110 108 108 106 100  CO2 '23 26 26 28 '$ 32  GLUCOSE 107* 142* 145* 137* 85  BUN '14 15 23 23 22  '$ CREATININE 0.49 0.47 0.68 0.61 0.60  CALCIUM 7.4* 9.3 9.0 9.2 8.9  MG  --  1.7  --  1.8 1.8  PHOS  --  3.5  --   --   --    Liver Function Tests: Recent Labs  Lab 08/25/22 1608 08/26/22 0600  AST 15 12*  ALT 9 10  ALKPHOS 42 48  BILITOT 0.6 0.3  PROT 5.9* 6.5  ALBUMIN 3.0* 3.4*   No results for input(s): "LIPASE", "AMYLASE" in the last 168 hours. No results for input(s): "AMMONIA" in the last 168 hours. CBC: Recent Labs  Lab 08/25/22 1608 08/26/22 0600 08/27/22 0546 08/28/22 0623 08/29/22 0708  WBC 12.6* 10.8* 19.5* 19.7* 17.0*  NEUTROABS  --  9.3* 17.8* 17.9* 12.3*  HGB 14.4 13.8 13.8 14.1 15.1*  HCT 45.7 43.2 44.8 45.6 46.9*  MCV 93.3 91.9 95.1 93.6 92.0  PLT 237 255 282 307 322   Cardiac Enzymes: No results for input(s): "CKTOTAL", "CKMB", "CKMBINDEX", "TROPONINI" in the last 168 hours. BNP: BNP (last 3 results) Recent Labs    08/27/22 0546 08/28/22 0625  BNP 243.5* 272.3*    ProBNP (last 3 results) No results for input(s): "PROBNP" in the last 8760 hours.  CBG: No results for input(s): "GLUCAP" in the last 168 hours.     Signed:  Irine Seal MD.  [facility] 08/29/2022, 2:28 PM

## 2022-08-29 NOTE — Progress Notes (Addendum)
SATURATION QUALIFICATIONS: (This note is used to comply with regulatory documentation for home oxygen)  Patient Saturations on Room Air at Rest = 89%  Patient Saturations on Room Air while Ambulating = 80%  Patient Saturations on 4 Liters of oxygen while Ambulating = 87%  Please briefly explain why patient needs home oxygen:

## 2022-08-29 NOTE — Progress Notes (Signed)
Mobility Specialist - Progress Note   08/29/22 0900  Mobility  Activity Ambulated with assistance in hallway  Level of Assistance Independent after set-up  Assistive Device None  Distance Ambulated (ft) 250 ft  Activity Response Tolerated well  $Mobility charge 1 Mobility   Pt received in bed and agreed to mobility, no c/o pain nor discomfort during ambulation. Pt to bed with all needs met.  Per nurse request instructed to mobilize pt on RA and record spO2 findings.  Patient Saturations on Room Air at Rest = spO2 89% Patient Saturations on 4 Liters of oxygen while Ambulating = sp02 85% Reported findings to nurse and left pt on 4L.  Roderick Pee Mobility Specialist

## 2022-08-29 NOTE — TOC Transition Note (Signed)
Transition of Care [facility]) - CM/SW Discharge Note   Patient Details  Name: Grace Nelson MRN: [id number] Date of Birth: 1951/10/31  Transition of Care [facility]) CM/SW Contact:  Leeroy Cha, RN Phone Number: 08/29/2022, 2:49 PM   Clinical Narrative:    See below for o2 orders, pt dcd to home   Final next level of care: [place] Barriers to Discharge: Continued Medical Work up   Patient Goals and CMS Choice Patient states their goals for this hospitalization and ongoing recovery are:: to go home CMS Medicare.gov Compare Post Acute Care list provided to:: Patient Choice offered to / list presented to : Patient  Discharge Placement                       Discharge Plan and Services   Discharge Planning Services: CM Consult Post Acute Care Choice: Durable Medical Equipment          DME Arranged: Oxygen DME Agency: [employer] Date DME Agency Contacted: 08/29/22 Time DME Agency Contacted: [phone] Representative spoke with at DME Agency: danielle            Social Determinants of Health ([place]) Interventions     Readmission Risk Interventions   No data to display

## 2022-08-30 ENCOUNTER — Other Ambulatory Visit (HOSPITAL_COMMUNITY): Payer: Self-pay

## 2022-09-01 ENCOUNTER — Other Ambulatory Visit (HOSPITAL_COMMUNITY): Payer: Self-pay

## 2022-09-01 DIAGNOSIS — J441 Chronic obstructive pulmonary disease with (acute) exacerbation: Secondary | ICD-10-CM | POA: Diagnosis not present

## 2022-09-01 DIAGNOSIS — Z6831 Body mass index (BMI) 31.0-31.9, adult: Secondary | ICD-10-CM | POA: Diagnosis not present

## 2022-09-09 DIAGNOSIS — Z1231 Encounter for screening mammogram for malignant neoplasm of breast: Secondary | ICD-10-CM | POA: Diagnosis not present

## 2022-09-16 NOTE — Progress Notes (Unsigned)
Synopsis: Referred for abnormal CT, COPD by Amelia Jo, PA  Subjective:   PATIENT ID: Grace Nelson GENDER: female DOB: [birth year], MRN: [id number]  No chief complaint on file.  89yF with history of AR, smoking, emphysema/COPD referred for same  Otherwise pertinent review of systems is negative.  Past Medical History:  Diagnosis Date   Depression    Osteopenia      Family History  Problem Relation Age of Onset   Ulcers Mother        pancreatic bleeding ulcer   Hypertension Mother    Cancer Mother        colon   COPD Father    Cancer Father        lung   Heart disease Father        before age 11   Heart attack Father    Kidney disease Brother      Past Surgical History:  Procedure Laterality Date   ABDOMINAL HYSTERECTOMY     APPENDECTOMY      Social History   Socioeconomic History   Marital status: Legally Separated    Spouse name: Not on file   Number of children: Not on file   Years of education: Not on file   Highest education level: Not on file  Occupational History   Not on file  Tobacco Use   Smoking status: Every Day    Packs/day: 1.00    Years: 45.00    Total pack years: 45.00    Types: Cigarettes   Smokeless tobacco: Never  Substance and Sexual Activity   Alcohol use: No    Alcohol/week: 0.0 standard drinks of alcohol   Drug use: No   Sexual activity: Not on file  Other Topics Concern   Not on file  Social History Narrative   Not on file   Social Determinants of Health   Financial Resource Strain: Not on file  Food Insecurity: No Food Insecurity (08/25/2022)   Hunger Vital Sign    Worried About Running Out of Food in the Last Year: Never true    Ran Out of Food in the Last Year: Never true  Transportation Needs: No Transportation Needs (08/25/2022)   PRAPARE - Hydrologist (Medical): No    Lack of Transportation (Non-Medical): No  Physical Activity: Not on file  Stress: Not on file  Social  Connections: Not on file  Intimate Partner Violence: Not At Risk (08/27/2022)   Humiliation, Afraid, Rape, and Kick questionnaire    Fear of Current or Ex-Partner: No    Emotionally Abused: No    Physically Abused: No    Sexually Abused: No     Allergies  Allergen Reactions   Nsaids    Sulfa Antibiotics Hives     Outpatient Medications Prior to Visit  Medication Sig Dispense Refill   acetaminophen (TYLENOL) 650 MG CR tablet Take 650 mg by mouth every 8 (eight) hours as needed for pain.     albuterol (VENTOLIN HFA) 108 (90 Base) MCG/ACT inhaler Inhale 2 puffs into the lungs every 6 (six) hours as needed for wheezing or shortness of breath. Use 3 times daily x 4 days then every 6 hours as needed. 8 g 2   cetirizine (ZYRTEC) 10 MG tablet Take 10 mg by mouth daily.     fluticasone (FLONASE) 50 MCG/ACT nasal spray Place 2 sprays into both nostrils daily. 16 g 0   mometasone-formoterol (DULERA) 200-5 MCG/ACT AERO Inhale 2 puffs into  the lungs 2 (two) times daily. 13 g 1   nicotine polacrilex (NICORETTE) 2 MG gum Chew 1 piece (2 mg total) by mouth when needed as directed for smoking cessation 110 tablet 0   pantoprazole (PROTONIX) 40 MG tablet Take 1 tablet (40 mg total) by mouth daily at 6 (six) AM. 30 tablet 1   simvastatin (ZOCOR) 20 MG tablet Take 20 mg by mouth at bedtime.     tiotropium (SPIRIVA HANDIHALER) 18 MCG inhalation capsule Place 1 capsule into inhaler and inhale daily. 30 capsule 1   VITAMIN D, CHOLECALCIFEROL, PO Take 1 tablet by mouth daily.     No facility-administered medications prior to visit.       Objective:   Physical Exam:  General appearance: 71 y.o., female, NAD, conversant  Eyes: anicteric sclerae; PERRL, tracking appropriately HENT: NCAT; MMM Neck: Trachea midline; no lymphadenopathy, no JVD Lungs: CTAB, no crackles, no wheeze, with normal respiratory effort CV: RRR, no murmur  Abdomen: Soft, non-tender; non-distended, BS present  Extremities: No  peripheral edema, warm Skin: Normal turgor and texture; no rash Psych: Appropriate affect Neuro: Alert and oriented to person and place, no focal deficit     There were no vitals filed for this visit.   on *** LPM *** RA BMI Readings from Last 3 Encounters:  08/28/22 32.50 kg/m  08/28/14 28.17 kg/m   Wt Readings from Last 3 Encounters:  08/28/22 177 lb 11.1 oz (80.6 kg)  08/28/14 164 lb 1.6 oz (74.4 kg)     CBC    Component Value Date/Time   WBC 17.0 (H) 08/29/2022 0708   RBC 5.10 08/29/2022 0708   HGB 15.1 (H) 08/29/2022 0708   HCT 46.9 (H) 08/29/2022 0708   PLT 322 08/29/2022 0708   MCV 92.0 08/29/2022 0708   MCH 29.6 08/29/2022 0708   MCHC 32.2 08/29/2022 0708   RDW 14.0 08/29/2022 0708   LYMPHSABS 3.4 08/29/2022 0708   MONOABS 1.2 (H) 08/29/2022 0708   EOSABS 0.0 08/29/2022 0708   BASOSABS 0.0 08/29/2022 0708    ***  Chest Imaging: CXR 08/28/22 reviewed by me with flattened diaphragms  CTA Chest 08/25/22 reviewed by me with emphysema, bronchial wall thickening, bl ll atelectasis, borderline mediastinal LAD  Pulmonary Functions Testing Results:    Latest Ref Rng & Units 08/19/2017    9:39 AM  PFT Results  FVC-Pre L 2.07   FVC-Predicted Pre % 67   FVC-Post L 2.07   FVC-Predicted Post % 67   Pre FEV1/FVC % % 60   Post FEV1/FCV % % 59   FEV1-Pre L 1.25   FEV1-Predicted Pre % 53   FEV1-Post L 1.23   DLCO uncorrected ml/min/mmHg 8.51   DLCO UNC% % 35   DLCO corrected ml/min/mmHg 8.20   DLCO COR %Predicted % 33   DLVA Predicted % 42   TLC L 5.37   TLC % Predicted % 106   RV % Predicted % 146     FeNO: ***  Pathology: ***  Echocardiogram: ***  Heart Catheterization: ***    Assessment & Plan:    Plan:      Maryjane Hurter, MD [facility] 09/16/2022 2:04 PM

## 2022-09-17 ENCOUNTER — Ambulatory Visit: Payer: Medicare HMO | Admitting: Pulmonary Disease

## 2022-09-17 ENCOUNTER — Encounter: Payer: Self-pay | Admitting: Pulmonary Disease

## 2022-09-17 VITALS — BP 118/66 | HR 75 | Temp 98.2°F | Ht 63.0 in | Wt 179.4 lb

## 2022-09-17 DIAGNOSIS — R0609 Other forms of dyspnea: Secondary | ICD-10-CM | POA: Diagnosis not present

## 2022-09-17 DIAGNOSIS — Z23 Encounter for immunization: Secondary | ICD-10-CM | POA: Diagnosis not present

## 2022-09-17 NOTE — Patient Instructions (Signed)
Use oxygen as needed Check your pulse ox to guide you to keep oxygen levels above 90% -Use the oxygen for any activity that dropped your oxygen below 88  Continue to use oxygen at night  Call us at any time to request for an overnight test -We will need to do the overnight attest off oxygen to see whether you still needed at night  Continue using Symbicort and Spiriva Use albuterol as needed  Schedule for breathing study at next visit in about 8 weeks  You will need to be walked around to see whether your oxygen goes lower to discontinue the oxygen during the day  Regular exercises is important to keep your activity level  The breathing study from 5 years ago will be labeled as stage II COPD  Continue with low-dose CT screenings

## 2022-10-07 DIAGNOSIS — Z0000 Encounter for general adult medical examination without abnormal findings: Secondary | ICD-10-CM | POA: Diagnosis not present

## 2022-10-07 DIAGNOSIS — J441 Chronic obstructive pulmonary disease with (acute) exacerbation: Secondary | ICD-10-CM | POA: Diagnosis not present

## 2022-10-07 DIAGNOSIS — Z1389 Encounter for screening for other disorder: Secondary | ICD-10-CM | POA: Diagnosis not present

## 2022-10-07 DIAGNOSIS — Z23 Encounter for immunization: Secondary | ICD-10-CM | POA: Diagnosis not present

## 2022-10-14 DIAGNOSIS — Z87891 Personal history of nicotine dependence: Secondary | ICD-10-CM | POA: Diagnosis not present

## 2022-10-14 DIAGNOSIS — I700 Atherosclerosis of aorta: Secondary | ICD-10-CM | POA: Diagnosis not present

## 2022-10-14 DIAGNOSIS — J449 Chronic obstructive pulmonary disease, unspecified: Secondary | ICD-10-CM | POA: Diagnosis not present

## 2022-10-14 DIAGNOSIS — Z6832 Body mass index (BMI) 32.0-32.9, adult: Secondary | ICD-10-CM | POA: Diagnosis not present

## 2022-11-17 ENCOUNTER — Encounter: Payer: Self-pay | Admitting: Adult Health

## 2022-11-17 ENCOUNTER — Ambulatory Visit: Payer: Medicare HMO | Admitting: Adult Health

## 2022-11-17 ENCOUNTER — Ambulatory Visit (INDEPENDENT_AMBULATORY_CARE_PROVIDER_SITE_OTHER): Payer: Medicare HMO | Admitting: Pulmonary Disease

## 2022-11-17 VITALS — BP 122/82 | HR 64 | Temp 97.6°F | Ht 64.0 in | Wt 191.0 lb

## 2022-11-17 DIAGNOSIS — R0609 Other forms of dyspnea: Secondary | ICD-10-CM

## 2022-11-17 DIAGNOSIS — J441 Chronic obstructive pulmonary disease with (acute) exacerbation: Secondary | ICD-10-CM

## 2022-11-17 DIAGNOSIS — J9611 Chronic respiratory failure with hypoxia: Secondary | ICD-10-CM

## 2022-11-17 LAB — PULMONARY FUNCTION TEST
DL/VA % pred: 51 %
DL/VA: 2.14 ml/min/mmHg/L
DLCO cor % pred: 46 %
DLCO cor: 9 ml/min/mmHg
DLCO unc % pred: 46 %
DLCO unc: 9 ml/min/mmHg
FEF 25-75 Post: 0.6 L/sec
FEF 25-75 Pre: 0.61 L/sec
FEF2575-%Change-Post: -1 %
FEF2575-%Pred-Post: 34 %
FEF2575-%Pred-Pre: 34 %
FEV1-%Change-Post: 1 %
FEV1-%Pred-Post: 55 %
FEV1-%Pred-Pre: 54 %
FEV1-Post: 1.21 L
FEV1-Pre: 1.19 L
FEV1FVC-%Change-Post: 0 %
FEV1FVC-%Pred-Pre: 82 %
FEV6-%Change-Post: 1 %
FEV6-%Pred-Post: 70 %
FEV6-%Pred-Pre: 69 %
FEV6-Post: 1.94 L
FEV6-Pre: 1.91 L
FEV6FVC-%Change-Post: 0 %
FEV6FVC-%Pred-Post: 104 %
FEV6FVC-%Pred-Pre: 104 %
FVC-%Change-Post: 1 %
FVC-%Pred-Post: 67 %
FVC-%Pred-Pre: 66 %
FVC-Post: 1.94 L
FVC-Pre: 1.92 L
Post FEV1/FVC ratio: 62 %
Post FEV6/FVC ratio: 100 %
Pre FEV1/FVC ratio: 62 %
Pre FEV6/FVC Ratio: 99 %
RV % pred: 153 %
RV: 3.46 L
TLC % pred: 116 %
TLC: 5.9 L

## 2022-11-17 NOTE — Patient Instructions (Addendum)
Continue on Symbicort  2 puffs Twice daily, rinse after use.  Continue on Spiriva 2 puffs daily  Albuterol inhaler As needed   Activity as tolerated.  Continue with Lung cancer screening CT program .  May discontinue Oxygen.  RSV vaccine in next couple of weeks  Refer to pulmonary rehab.  Follow up with Dr. Ander Slade in 4 months and As needed

## 2022-11-17 NOTE — Patient Instructions (Signed)
Full PFT performed today. °

## 2022-11-17 NOTE — Progress Notes (Signed)
Full PFT performed today. °

## 2022-11-17 NOTE — Addendum Note (Signed)
Addended by: June Leap on: 11/17/2022 12:05 PM   Modules accepted: Orders

## 2022-11-17 NOTE — Progress Notes (Signed)
$'@Patient'm$  ID: Grace Nelson, female    DOB: 11/30/1951, 71 y.o.   MRN: [id number]  Chief Complaint  Patient presents with   Follow-up    Referring provider: [name], MD  HPI: 71 year old female former smoker quit September 2023 seen for pulmonary consult October 2023 after recent hospitalization for COPD exacerbation  TEST/EVENTS :  PFTs showed moderate COPD August 19, 2017 showed FEV1 at 53%, ratio 59, FVC 67%, no significant bronchodilator response, mid flow reversibility, DLCO 35%.   11/17/2022 Follow up : COPD , O2 RF  Patient presents for a 27-monthfollow-up.  Patient was seen last visit for pulmonary consult.  She had recently been hospitalized in October for COPD exacerbation.  She was started on oxygen at discharge.  Patient was set up for pulmonary function testing that was completed today that shows stable lung function with FEV1 at 55%, ratio 62, FVC 67%, no significant bronchodilator response, DLCO 46%.  (Patient did take Symbicort and Spiriva prior to her PFT.).  Patient says since last office visit she is doing better.  She is had no increased cough or congestion.  She has stopped using her oxygen.  She was discharged on oxygen at 2 L with activity and at bedtime.  Says that she has not wearing it for a while.  Today in the office walk test showed no desaturations she remained at 92% on room air walking.  We discussed setting about overnight oximetry test.  Patient declines and says she wants to get rid of the oxygen.  She is starting to feel better and wants to get out of her house.  Patient was advised on potential complications of untreated hypoxemia. She does participate in the lung cancer screening program. She says she is somewhat sedentary.  Not very active.  We discussed pulmonary rehab and she is in agreement. She denies any hemoptysis, chest pain, orthopnea, edema.  Allergies  Allergen Reactions   Nsaids    Sulfa Antibiotics Hives    Immunization  History  Administered Date(s) Administered   Fluad Quad(high Dose 65+) 09/17/2022   PFIZER(Purple Top)SARS-COV-2 Vaccination 12/23/2019, 01/14/2020, 09/29/2020   Zoster Recombinat (Shingrix) 09/02/2018, 11/29/2018    Past Medical History:  Diagnosis Date   Depression    Osteopenia     Tobacco History: Social History   Tobacco Use  Smoking Status Former   Years: 45.00   Types: Cigarettes   Quit date: 08/25/2022   Years since quitting: 0.2  Smokeless Tobacco Never  Tobacco Comments   Pt quit smoking 08/25/22 Anika R Chambers-Shaw 09/17/22      Counseling given: Not Answered Tobacco comments: Pt quit smoking 08/25/22 ALoma Sousa10/18/23    Outpatient Medications Prior to Visit  Medication Sig Dispense Refill   acetaminophen (TYLENOL) 650 MG CR tablet Take 650 mg by mouth every 8 (eight) hours as needed for pain.     albuterol (VENTOLIN HFA) 108 (90 Base) MCG/ACT inhaler Inhale 2 puffs into the lungs every 6 (six) hours as needed for wheezing or shortness of breath. Use 3 times daily x 4 days then every 6 hours as needed. 8 g 2   cetirizine (ZYRTEC) 10 MG tablet Take 10 mg by mouth daily.     fluticasone (FLONASE) 50 MCG/ACT nasal spray Place 2 sprays into both nostrils daily. 16 g 0   nicotine polacrilex (NICORETTE) 2 MG gum Chew 1 piece (2 mg total) by mouth when needed as directed for smoking cessation 110 tablet 0  simvastatin (ZOCOR) 20 MG tablet Take 20 mg by mouth at bedtime.     SYMBICORT 160-4.5 MCG/ACT inhaler Inhale into the lungs.     tiotropium (SPIRIVA HANDIHALER) 18 MCG inhalation capsule Place 1 capsule into inhaler and inhale daily. 30 capsule 1   VITAMIN D, CHOLECALCIFEROL, PO Take 1 tablet by mouth daily.     mometasone-formoterol (DULERA) 200-5 MCG/ACT AERO Inhale 2 puffs into the lungs 2 (two) times daily. (Patient not taking: Reported on 09/17/2022) 13 g 1   pantoprazole (PROTONIX) 40 MG tablet Take 1 tablet (40 mg total) by mouth daily at 6  (six) AM. (Patient not taking: Reported on 11/17/2022) 30 tablet 1   No facility-administered medications prior to visit.     Review of Systems:   Constitutional:   No  weight loss, night sweats,  Fevers, chills,  +fatigue, or  lassitude.  HEENT:   No headaches,  Difficulty swallowing,  Tooth/dental problems, or  Sore throat,                No sneezing, itching, ear ache, nasal congestion, post nasal drip,   CV:  No chest pain,  Orthopnea, PND, swelling in lower extremities, anasarca, dizziness, palpitations, syncope.   GI  No heartburn, indigestion, abdominal pain, nausea, vomiting, diarrhea, change in bowel habits, loss of appetite, bloody stools.   Resp:   No chest wall deformity  Skin: no rash or lesions.  GU: no dysuria, change in color of urine, no urgency or frequency.  No flank pain, no hematuria   MS:  No joint pain or swelling.  No decreased range of motion.  No back pain.    Physical Exam  BP 122/82 (BP Location: Left Arm, Patient Position: Sitting, Cuff Size: Normal)   Pulse 64   Temp 97.6 F (36.4 C) (Oral)   Ht '5\' 4"'$  (1.626 m)   Wt 191 lb (86.6 kg)   SpO2 95%   BMI 32.79 kg/m   GEN: A/Ox3; pleasant , NAD, well nourished    HEENT:  [place]/AT,  NOSE-clear, THROAT-clear, no lesions, no postnasal drip or exudate noted.   NECK:  Supple w/ fair ROM; no JVD; normal carotid impulses w/o bruits; no thyromegaly or nodules palpated; no lymphadenopathy.    RESP  Clear  P & A; w/o, wheezes/ rales/ or rhonchi. no accessory muscle use, no dullness to percussion  CARD:  RRR, no m/r/g, no peripheral edema, pulses intact, no cyanosis or clubbing.  GI:   Soft & nt; nml bowel sounds; no organomegaly or masses detected.   Musco: Warm bil, no deformities or joint swelling noted.   Neuro: alert, no focal deficits noted.    Skin: Warm, no lesions or rashes    Lab Results:  CBC      ProBNP No results found for: "PROBNP"  Imaging: No results found.        Latest Ref Rng & Units 11/17/2022    9:52 AM 08/19/2017    9:39 AM  PFT Results  FVC-Pre L 1.92  P 2.07   FVC-Predicted Pre % 66  P 67   FVC-Post L 1.94  P 2.07   FVC-Predicted Post % 67  P 67   Pre FEV1/FVC % % 62  P 60   Post FEV1/FCV % % 62  P 59   FEV1-Pre L 1.19  P 1.25   FEV1-Predicted Pre % 54  P 53   FEV1-Post L 1.21  P 1.23   DLCO uncorrected ml/min/mmHg 9.00  P 8.51   DLCO UNC% % 46  P 35   DLCO corrected ml/min/mmHg 9.00  P 8.20   DLCO COR %Predicted % 46  P 33   DLVA Predicted % 51  P 42   TLC L 5.90  P 5.37   TLC % Predicted % 116  P 106   RV % Predicted % 153  P 146     P Preliminary result    No results found for: "NITRICOXIDE"      Assessment & Plan:   COPD with acute exacerbation (HCC) Moderate COPD.  Patient has quit smoking in September.  Congratulated on smoking cessation.  Will continue with lung cancer screening program.  Will continue on triple therapy maintenance regimen with Symbicort and Spiriva.  Refer to pulmonary rehab. PFTs were reviewed in detail today.  Lung function appears stable since 2018.  Plan  Patient Instructions  Continue on Symbicort  2 puffs Twice daily, rinse after use.  Continue on Spiriva 2 puffs daily  Albuterol inhaler As needed   Activity as tolerated.  Continue with Lung cancer screening CT program .  May discontinue Oxygen.  RSV vaccine in next couple of weeks  Refer to pulmonary rehab.  Follow up with Dr. Ander Slade in 4 months and As needed       Chronic respiratory failure with hypoxia ([place]) Walk test today in the office shows no significant desaturations.  Recommend overnight oximetry test.  Patient declines.  May discontinue oxygen.  Plan  Patient Instructions  Continue on Symbicort  2 puffs Twice daily, rinse after use.  Continue on Spiriva 2 puffs daily  Albuterol inhaler As needed   Activity as tolerated.  Continue with Lung cancer screening CT program .  May discontinue Oxygen.  RSV vaccine in next  couple of weeks  Refer to pulmonary rehab.  Follow up with Dr. Ander Slade in 4 months and As needed         Rexene Edison, NP 11/17/2022

## 2022-11-17 NOTE — Assessment & Plan Note (Addendum)
Moderate COPD.  Patient has quit smoking in September.  Congratulated on smoking cessation.  Will continue with lung cancer screening program.  Will continue on triple therapy maintenance regimen with Symbicort and Spiriva.  Refer to pulmonary rehab. PFTs were reviewed in detail today.  Lung function appears stable since 2018.  Plan  Patient Instructions  Continue on Symbicort  2 puffs Twice daily, rinse after use.  Continue on Spiriva 2 puffs daily  Albuterol inhaler As needed   Activity as tolerated.  Continue with Lung cancer screening CT program .  May discontinue Oxygen.  RSV vaccine in next couple of weeks  Refer to pulmonary rehab.  Follow up with Dr. Ander Slade in 4 months and As needed

## 2022-11-17 NOTE — Assessment & Plan Note (Signed)
Walk test today in the office shows no significant desaturations.  Recommend overnight oximetry test.  Patient declines.  May discontinue oxygen.  Plan  Patient Instructions  Continue on Symbicort  2 puffs Twice daily, rinse after use.  Continue on Spiriva 2 puffs daily  Albuterol inhaler As needed   Activity as tolerated.  Continue with Lung cancer screening CT program .  May discontinue Oxygen.  RSV vaccine in next couple of weeks  Refer to pulmonary rehab.  Follow up with Dr. Ander Slade in 4 months and As needed

## 2022-12-17 DIAGNOSIS — D1801 Hemangioma of skin and subcutaneous tissue: Secondary | ICD-10-CM | POA: Diagnosis not present

## 2022-12-17 DIAGNOSIS — C44519 Basal cell carcinoma of skin of other part of trunk: Secondary | ICD-10-CM | POA: Diagnosis not present

## 2022-12-17 DIAGNOSIS — B078 Other viral warts: Secondary | ICD-10-CM | POA: Diagnosis not present

## 2022-12-17 DIAGNOSIS — L570 Actinic keratosis: Secondary | ICD-10-CM | POA: Diagnosis not present

## 2022-12-17 DIAGNOSIS — L814 Other melanin hyperpigmentation: Secondary | ICD-10-CM | POA: Diagnosis not present

## 2022-12-17 DIAGNOSIS — Z85828 Personal history of other malignant neoplasm of skin: Secondary | ICD-10-CM | POA: Diagnosis not present

## 2022-12-17 DIAGNOSIS — L821 Other seborrheic keratosis: Secondary | ICD-10-CM | POA: Diagnosis not present

## 2023-01-21 ENCOUNTER — Other Ambulatory Visit: Payer: Self-pay | Admitting: Acute Care

## 2023-01-21 DIAGNOSIS — F17210 Nicotine dependence, cigarettes, uncomplicated: Secondary | ICD-10-CM

## 2023-01-21 DIAGNOSIS — Z87891 Personal history of nicotine dependence: Secondary | ICD-10-CM

## 2023-01-21 DIAGNOSIS — Z122 Encounter for screening for malignant neoplasm of respiratory organs: Secondary | ICD-10-CM

## 2023-01-23 DIAGNOSIS — I700 Atherosclerosis of aorta: Secondary | ICD-10-CM | POA: Diagnosis not present

## 2023-02-11 DIAGNOSIS — E785 Hyperlipidemia, unspecified: Secondary | ICD-10-CM | POA: Diagnosis not present

## 2023-02-11 DIAGNOSIS — M8580 Other specified disorders of bone density and structure, unspecified site: Secondary | ICD-10-CM | POA: Diagnosis not present

## 2023-02-11 DIAGNOSIS — J449 Chronic obstructive pulmonary disease, unspecified: Secondary | ICD-10-CM | POA: Diagnosis not present

## 2023-02-16 IMAGING — CT CT CHEST LUNG CANCER SCREENING LOW DOSE W/O CM
1 of 2 series · 10 of 20 positions shown, 13 images · non-contrast
Comparison: 02/24/2020

CLINICAL DATA: Thirty pack-year smoking history.  Current smoker.

EXAM:
CT CHEST WITHOUT CONTRAST LOW-DOSE FOR LUNG CANCER SCREENING
TECHNIQUE: Multidetector CT imaging of the chest was performed following the
standard protocol without IV contrast.

[ct lung segmentation data · axial · 0.69mm/px · z∈[-387,-387]mm · 10 of 355 frames shown]
[frame 1/355  mediastinal]
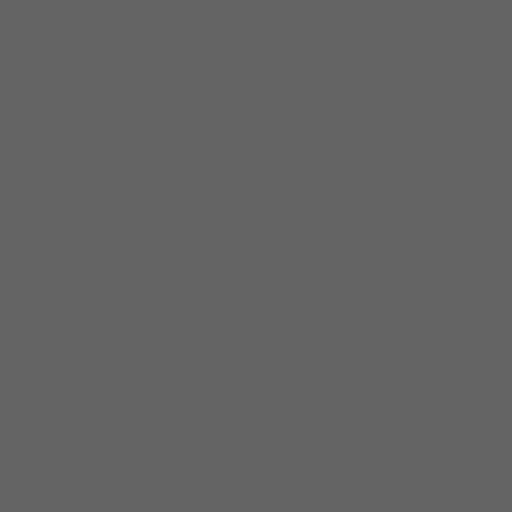
[frame 1/355  lung]
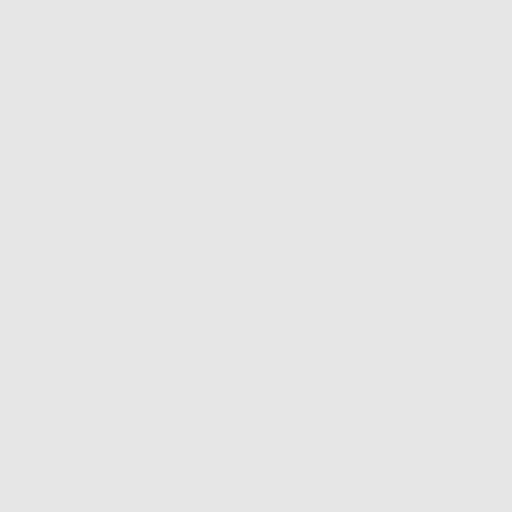
[frame 40/355  lung]
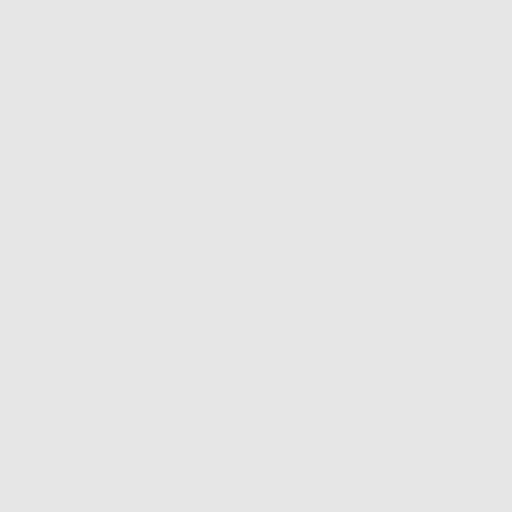
[frame 79/355  lung]
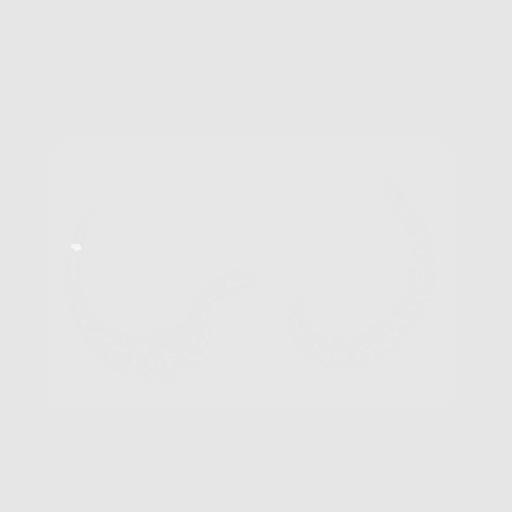
[frame 119/355  lung]
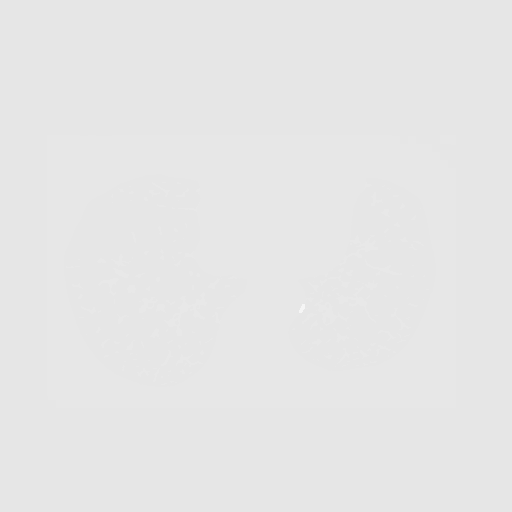
[frame 158/355  mediastinal]
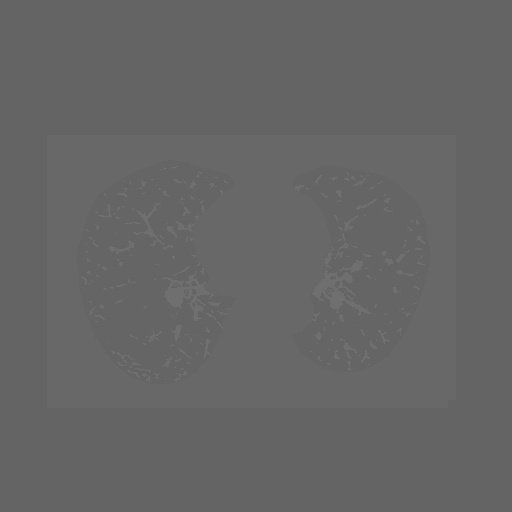
[frame 158/355  lung]
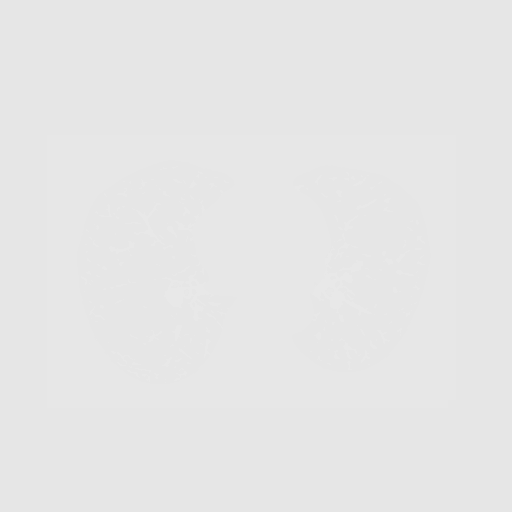
[frame 197/355  lung]
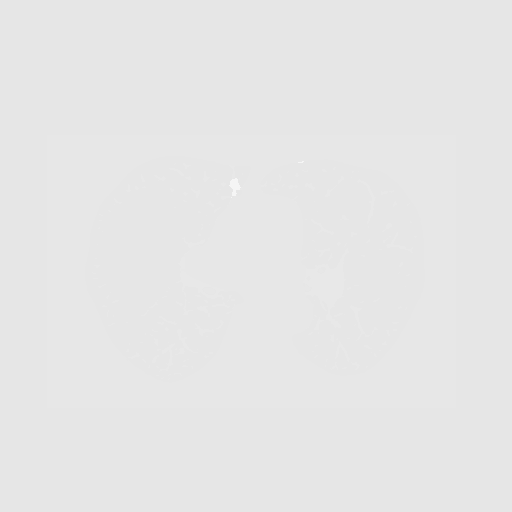
[frame 237/355  lung]
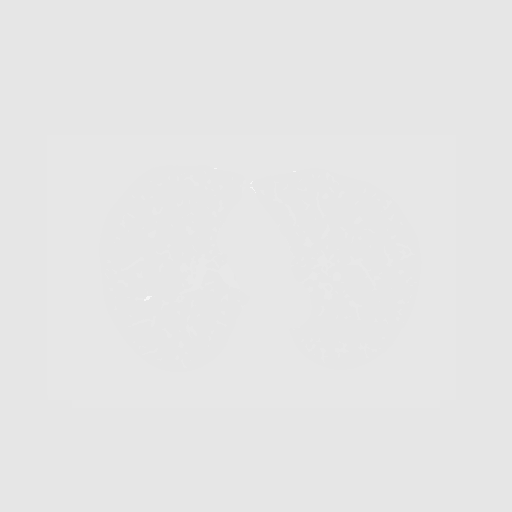
[frame 276/355  lung]
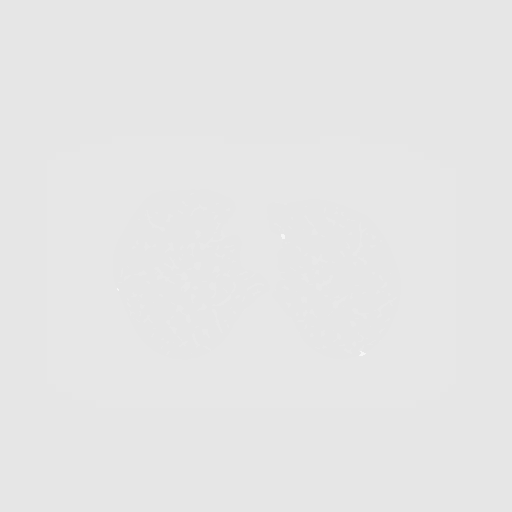
[frame 315/355  mediastinal]
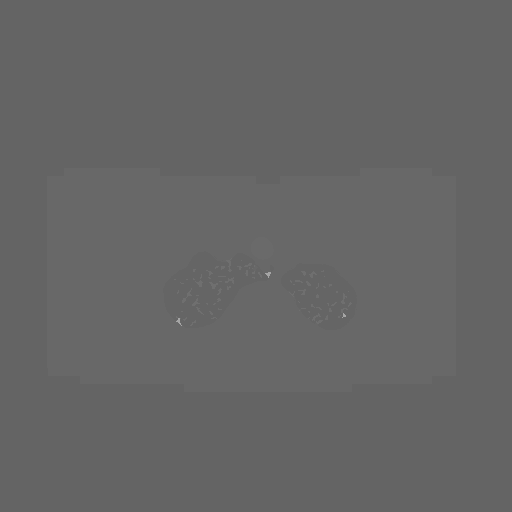
[frame 315/355  lung]
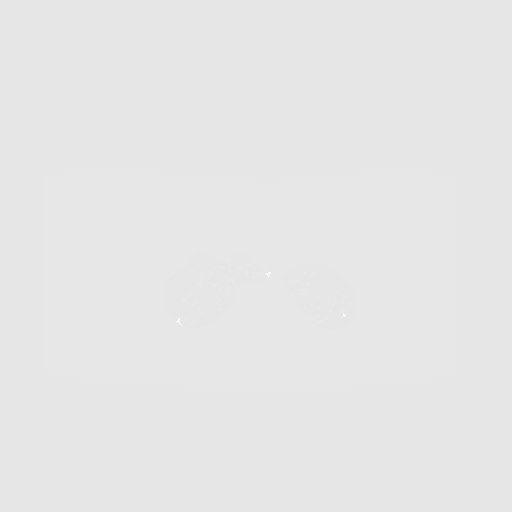
[frame 355/355  lung]
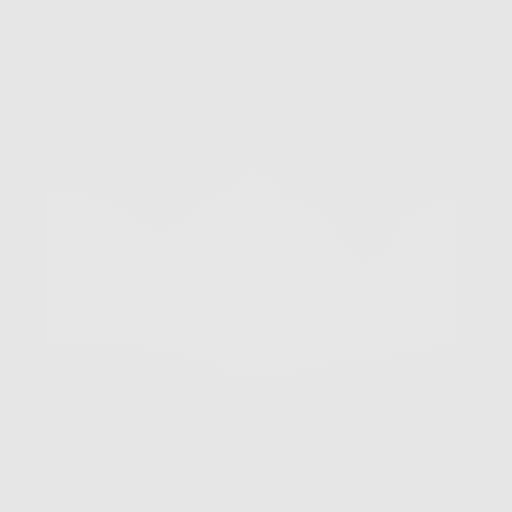

[10 of 20 positions shown; findings below may reference images not displayed]

FINDINGS: Cardiovascular: Aortic atherosclerosis. rNormal heart size, without
pericardial effusion. Multivessel coronary artery atherosclerosis.

Mediastinum/Nodes: No mediastinal or definite hilar adenopathy,
given limitations of unenhanced CT. Small hiatal hernia.

Lungs/Pleura: No pleural fluid. Mild centrilobular emphysema. Lower
lobe predominant bronchial wall thickening. Mucoid impaction within
the right lower lobe. Pulmonary nodules of maximally volume derived
equivalent diameter 6.5 mm.

Upper Abdomen: Normal imaged portions of the liver, spleen,
pancreas, adrenal glands, left kidney, gallbladder.

Musculoskeletal: Upper lumbar spondylosis. Mild convex right
thoracic spine curvature.
IMPRESSION: 1. Lung-RADS 2, benign appearance or behavior. Continue annual
screening with low-dose chest CT without contrast in 12 months.
2. Aortic Atherosclerosis (QJEYK-YTM.M) and Emphysema (QJEYK-5UY.7).
Coronary artery atherosclerosis.
3. Small hiatal hernia.

## 2023-03-23 DIAGNOSIS — R5383 Other fatigue: Secondary | ICD-10-CM | POA: Diagnosis not present

## 2023-03-23 DIAGNOSIS — Z6835 Body mass index (BMI) 35.0-35.9, adult: Secondary | ICD-10-CM | POA: Diagnosis not present

## 2023-03-23 DIAGNOSIS — E559 Vitamin D deficiency, unspecified: Secondary | ICD-10-CM | POA: Diagnosis not present

## 2023-03-23 DIAGNOSIS — Z1331 Encounter for screening for depression: Secondary | ICD-10-CM | POA: Diagnosis not present

## 2023-03-23 DIAGNOSIS — E8889 Other specified metabolic disorders: Secondary | ICD-10-CM | POA: Diagnosis not present

## 2023-03-23 DIAGNOSIS — J449 Chronic obstructive pulmonary disease, unspecified: Secondary | ICD-10-CM | POA: Diagnosis not present

## 2023-03-27 ENCOUNTER — Ambulatory Visit (HOSPITAL_BASED_OUTPATIENT_CLINIC_OR_DEPARTMENT_OTHER)
Admission: RE | Admit: 2023-03-27 | Discharge: 2023-03-27 | Disposition: A | Discharge status: Home / Self Care | Payer: Medicare HMO | Source: Ambulatory Visit | Attending: Family Medicine | Admitting: Family Medicine

## 2023-03-27 DIAGNOSIS — F17210 Nicotine dependence, cigarettes, uncomplicated: Secondary | ICD-10-CM

## 2023-03-27 DIAGNOSIS — Z122 Encounter for screening for malignant neoplasm of respiratory organs: Secondary | ICD-10-CM | POA: Diagnosis not present

## 2023-03-27 DIAGNOSIS — Z87891 Personal history of nicotine dependence: Secondary | ICD-10-CM | POA: Diagnosis not present

## 2023-04-01 ENCOUNTER — Other Ambulatory Visit: Payer: Self-pay | Admitting: Acute Care

## 2023-04-01 DIAGNOSIS — F17210 Nicotine dependence, cigarettes, uncomplicated: Secondary | ICD-10-CM

## 2023-04-01 DIAGNOSIS — Z87891 Personal history of nicotine dependence: Secondary | ICD-10-CM

## 2023-04-01 DIAGNOSIS — Z122 Encounter for screening for malignant neoplasm of respiratory organs: Secondary | ICD-10-CM

## 2023-04-07 DIAGNOSIS — I700 Atherosclerosis of aorta: Secondary | ICD-10-CM | POA: Diagnosis not present

## 2023-04-07 DIAGNOSIS — Z6835 Body mass index (BMI) 35.0-35.9, adult: Secondary | ICD-10-CM | POA: Diagnosis not present

## 2023-04-07 DIAGNOSIS — I2510 Atherosclerotic heart disease of native coronary artery without angina pectoris: Secondary | ICD-10-CM | POA: Diagnosis not present

## 2023-04-07 DIAGNOSIS — E669 Obesity, unspecified: Secondary | ICD-10-CM | POA: Diagnosis not present

## 2023-04-21 DIAGNOSIS — Z87891 Personal history of nicotine dependence: Secondary | ICD-10-CM | POA: Diagnosis not present

## 2023-04-21 DIAGNOSIS — E669 Obesity, unspecified: Secondary | ICD-10-CM | POA: Diagnosis not present

## 2023-04-21 DIAGNOSIS — I700 Atherosclerosis of aorta: Secondary | ICD-10-CM | POA: Diagnosis not present

## 2023-04-21 DIAGNOSIS — J449 Chronic obstructive pulmonary disease, unspecified: Secondary | ICD-10-CM | POA: Diagnosis not present

## 2023-04-22 DIAGNOSIS — E669 Obesity, unspecified: Secondary | ICD-10-CM | POA: Diagnosis not present

## 2023-04-22 DIAGNOSIS — Z6835 Body mass index (BMI) 35.0-35.9, adult: Secondary | ICD-10-CM | POA: Diagnosis not present

## 2023-04-22 DIAGNOSIS — I2510 Atherosclerotic heart disease of native coronary artery without angina pectoris: Secondary | ICD-10-CM | POA: Diagnosis not present

## 2023-04-30 DIAGNOSIS — I700 Atherosclerosis of aorta: Secondary | ICD-10-CM | POA: Diagnosis not present

## 2023-04-30 DIAGNOSIS — J449 Chronic obstructive pulmonary disease, unspecified: Secondary | ICD-10-CM | POA: Diagnosis not present

## 2023-04-30 DIAGNOSIS — M8580 Other specified disorders of bone density and structure, unspecified site: Secondary | ICD-10-CM | POA: Diagnosis not present

## 2023-04-30 DIAGNOSIS — E785 Hyperlipidemia, unspecified: Secondary | ICD-10-CM | POA: Diagnosis not present

## 2023-05-12 DIAGNOSIS — I700 Atherosclerosis of aorta: Secondary | ICD-10-CM | POA: Diagnosis not present

## 2023-05-12 DIAGNOSIS — E669 Obesity, unspecified: Secondary | ICD-10-CM | POA: Diagnosis not present

## 2023-05-12 DIAGNOSIS — Z6834 Body mass index (BMI) 34.0-34.9, adult: Secondary | ICD-10-CM | POA: Diagnosis not present

## 2023-06-03 DIAGNOSIS — R432 Parageusia: Secondary | ICD-10-CM | POA: Diagnosis not present

## 2023-06-03 DIAGNOSIS — E669 Obesity, unspecified: Secondary | ICD-10-CM | POA: Diagnosis not present

## 2023-06-03 DIAGNOSIS — Z6833 Body mass index (BMI) 33.0-33.9, adult: Secondary | ICD-10-CM | POA: Diagnosis not present

## 2023-06-03 DIAGNOSIS — I700 Atherosclerosis of aorta: Secondary | ICD-10-CM | POA: Diagnosis not present

## 2023-06-22 DIAGNOSIS — E669 Obesity, unspecified: Secondary | ICD-10-CM | POA: Diagnosis not present

## 2023-06-22 DIAGNOSIS — I700 Atherosclerosis of aorta: Secondary | ICD-10-CM | POA: Diagnosis not present

## 2023-06-22 DIAGNOSIS — Z6833 Body mass index (BMI) 33.0-33.9, adult: Secondary | ICD-10-CM | POA: Diagnosis not present

## 2023-07-10 ENCOUNTER — Ambulatory Visit: Payer: Medicare HMO | Admitting: Pulmonary Disease

## 2023-07-10 ENCOUNTER — Encounter: Payer: Self-pay | Admitting: Pulmonary Disease

## 2023-07-10 VITALS — BP 120/70 | HR 70 | Ht 63.0 in | Wt 191.6 lb

## 2023-07-10 DIAGNOSIS — R0609 Other forms of dyspnea: Secondary | ICD-10-CM

## 2023-07-10 NOTE — Patient Instructions (Signed)
We will give you samples of Trelegy 100 to have a hand hand to use in place of your inhaler  Continue using current inhaler until you run out of from  You can call us at any time to send in a prescription for the Trelegy if it works well for you  Continue graded activities as tolerated  Call us with significant concerns  Follow-up in 6 months

## 2023-07-10 NOTE — Progress Notes (Signed)
Synopsis: Referred for abnormal CT, COPD by Sigmund Hazel, MD  Subjective:   PATIENT ID: Grace Nelson GENDER: female DOB: 12/29/1950, MRN: [id number]   History of chronic obstructive pulmonary disease  Shortness of breath on exertion  Has been having some hoarseness with Symbicort and Spiriva which she uses about once a day for both  COPD diagnosed when she was recently hospitalized  Low-dose CT does show emphysema   PFT from 5 years ago did show an FEV1 in the 50s, severe reduction in diffusing capacity  She continues to do relatively well  Some shortness of breath with exertion No significant cough  Past Medical History:  Diagnosis Date   Depression    Osteopenia      Family History  Problem Relation Age of Onset   Ulcers Mother        pancreatic bleeding ulcer   Hypertension Mother    Cancer Mother        colon   COPD Father    Cancer Father        lung   Heart disease Father        before age 60   Heart attack Father    Kidney disease Brother      Past Surgical History:  Procedure Laterality Date   ABDOMINAL HYSTERECTOMY     APPENDECTOMY      Social History   Socioeconomic History   Marital status: Married    Spouse name: Not on file   Number of children: Not on file   Years of education: Not on file   Highest education level: Not on file  Occupational History   Not on file  Tobacco Use   Smoking status: Former    Current packs/day: 0.00    Types: Cigarettes    Start date: 08/25/1977    Quit date: 08/25/2022    Years since quitting: 0.8   Smokeless tobacco: Never   Tobacco comments:    Pt quit smoking 08/25/22 Glynda Jaeger 09/17/22      Substance and Sexual Activity   Alcohol use: No    Alcohol/week: 0.0 standard drinks of alcohol   Drug use: No   Sexual activity: Not on file  Other Topics Concern   Not on file  Social History Narrative   Not on file   Social Determinants of Health   Financial Resource Strain:  Not on file  Food Insecurity: No Food Insecurity (08/25/2022)   Hunger Vital Sign    Worried About Running Out of Food in the Last Year: Never true    Ran Out of Food in the Last Year: Never true  Transportation Needs: No Transportation Needs (08/25/2022)   PRAPARE - Administrator, Civil Service (Medical): No    Lack of Transportation (Non-Medical): No  Physical Activity: Not on file  Stress: Not on file  Social Connections: Not on file  Intimate Partner Violence: Not At Risk (08/27/2022)   Humiliation, Afraid, Rape, and Kick questionnaire    Fear of Current or Ex-Partner: No    Emotionally Abused: No    Physically Abused: No    Sexually Abused: No     Allergies  Allergen Reactions   Nsaids    Sulfa Antibiotics Hives     Outpatient Medications Prior to Visit  Medication Sig Dispense Refill   acetaminophen (TYLENOL) 650 MG CR tablet Take 650 mg by mouth every 8 (eight) hours as needed for pain.     albuterol (VENTOLIN HFA)  108 (90 Base) MCG/ACT inhaler Inhale 2 puffs into the lungs every 6 (six) hours as needed for wheezing or shortness of breath. Use 3 times daily x 4 days then every 6 hours as needed. 8 g 2   cetirizine (ZYRTEC) 10 MG tablet Take 10 mg by mouth daily.     fluticasone (FLONASE) 50 MCG/ACT nasal spray Place 2 sprays into both nostrils daily. 16 g 0   nicotine polacrilex (NICORETTE) 2 MG gum Chew 1 piece (2 mg total) by mouth when needed as directed for smoking cessation 110 tablet 0   simvastatin (ZOCOR) 20 MG tablet Take 20 mg by mouth at bedtime.     SYMBICORT 160-4.5 MCG/ACT inhaler Inhale into the lungs.     tiotropium (SPIRIVA HANDIHALER) 18 MCG inhalation capsule Place 1 capsule into inhaler and inhale daily. 30 capsule 1   VITAMIN D, CHOLECALCIFEROL, PO Take 1 tablet by mouth daily.     mometasone-formoterol (DULERA) 200-5 MCG/ACT AERO Inhale 2 puffs into the lungs 2 (two) times daily. (Patient not taking: Reported on 09/17/2022) 13 g 1    pantoprazole (PROTONIX) 40 MG tablet Take 1 tablet (40 mg total) by mouth daily at 6 (six) AM. (Patient not taking: Reported on 11/17/2022) 30 tablet 1   No facility-administered medications prior to visit.     Objective:   Physical Exam:  Elderly, does not appear to be in distress Moist oral mucosa Clear breath sounds S1-S2 appreciated Bowel sounds appreciated Extremities shows no clubbing no edema  BMI Readings from Last 3 Encounters:  07/10/23 33.94 kg/m  11/17/22 32.79 kg/m  09/17/22 31.78 kg/m   Wt Readings from Last 3 Encounters:  07/10/23 191 lb 9.6 oz (86.9 kg)  11/17/22 191 lb (86.6 kg)  09/17/22 179 lb 6.4 oz (81.4 kg)     CBC    Component Value Date/Time   WBC 17.0 (H) 08/29/2022 0708   RBC 5.10 08/29/2022 0708   HGB 15.1 (H) 08/29/2022 0708   HCT 46.9 (H) 08/29/2022 0708   PLT 322 08/29/2022 0708   MCV 92.0 08/29/2022 0708   MCH 29.6 08/29/2022 0708   MCHC 32.2 08/29/2022 0708   RDW 14.0 08/29/2022 0708   LYMPHSABS 3.4 08/29/2022 0708   MONOABS 1.2 (H) 08/29/2022 0708   EOSABS 0.0 08/29/2022 0708   BASOSABS 0.0 08/29/2022 0708     Chest Imaging: CXR 08/28/22 reviewed by me with flattened diaphragms  CTA Chest 08/25/22 reviewed by me with emphysema, bronchial wall thickening, borderline adenopathy, atelectasis  Pulmonary Functions Testing Results:    Latest Ref Rng & Units 11/17/2022    9:52 AM 08/19/2017    9:39 AM  PFT Results  FVC-Pre L 1.92  2.07   FVC-Predicted Pre % 66  67   FVC-Post L 1.94  2.07   FVC-Predicted Post % 67  67   Pre FEV1/FVC % % 62  60   Post FEV1/FCV % % 62  59   FEV1-Pre L 1.19  1.25   FEV1-Predicted Pre % 54  53   FEV1-Post L 1.21  1.23   DLCO uncorrected ml/min/mmHg 9.00  8.51   DLCO UNC% % 46  35   DLCO corrected ml/min/mmHg 9.00  8.20   DLCO COR %Predicted % 46  33   DLVA Predicted % 51  42   TLC L 5.90  5.37   TLC % Predicted % 116  106   RV % Predicted % 153  146      Echocardiogram: Ejection  fraction of 60 to 65%, right ventricular function is normal     Assessment & Plan:   History of obstructive lung disease  Abnormal CT showing atelectasis at the bases of the lungs  COPD stage II  Reformed smoker  Plan  Continue Symbicort and Spiriva  Will give her samples of Trelegy to try  Follow-up in 6 months  Encouraged to call with significant concerns   Encouraged to use rescue inhaler if needed, has not been needing it

## 2023-07-15 DIAGNOSIS — E669 Obesity, unspecified: Secondary | ICD-10-CM | POA: Diagnosis not present

## 2023-07-15 DIAGNOSIS — Z6832 Body mass index (BMI) 32.0-32.9, adult: Secondary | ICD-10-CM | POA: Diagnosis not present

## 2023-07-15 DIAGNOSIS — I700 Atherosclerosis of aorta: Secondary | ICD-10-CM | POA: Diagnosis not present

## 2023-08-12 DIAGNOSIS — I700 Atherosclerosis of aorta: Secondary | ICD-10-CM | POA: Diagnosis not present

## 2023-08-12 DIAGNOSIS — Z6832 Body mass index (BMI) 32.0-32.9, adult: Secondary | ICD-10-CM | POA: Diagnosis not present

## 2023-08-12 DIAGNOSIS — E669 Obesity, unspecified: Secondary | ICD-10-CM | POA: Diagnosis not present

## 2023-09-09 DIAGNOSIS — E669 Obesity, unspecified: Secondary | ICD-10-CM | POA: Diagnosis not present

## 2023-09-09 DIAGNOSIS — I700 Atherosclerosis of aorta: Secondary | ICD-10-CM | POA: Diagnosis not present

## 2023-09-09 DIAGNOSIS — Z6831 Body mass index (BMI) 31.0-31.9, adult: Secondary | ICD-10-CM | POA: Diagnosis not present

## 2023-09-15 DIAGNOSIS — Z1231 Encounter for screening mammogram for malignant neoplasm of breast: Secondary | ICD-10-CM | POA: Diagnosis not present

## 2023-09-29 ENCOUNTER — Telehealth: Payer: Self-pay | Admitting: Pulmonary Disease

## 2023-09-29 NOTE — Telephone Encounter (Signed)
Pt express she did like the Trelegy samples & is calling in to get a prescription sent in for Trelegy for a 3-mon supply. Pharmacy: [facility] - [place], [place] - [address]

## 2023-10-03 MED ORDER — TRELEGY ELLIPTA 100-62.5-25 MCG/ACT IN AEPB: 1.0000 | INHALATION_SPRAY | Freq: Every day | RESPIRATORY_TRACT | 3 refills | Status: DC

## 2023-10-03 NOTE — Telephone Encounter (Signed)
Rx sent to mail order pharmacy for pt

## 2023-10-07 DIAGNOSIS — Z6831 Body mass index (BMI) 31.0-31.9, adult: Secondary | ICD-10-CM | POA: Diagnosis not present

## 2023-10-07 DIAGNOSIS — E669 Obesity, unspecified: Secondary | ICD-10-CM | POA: Diagnosis not present

## 2023-10-07 DIAGNOSIS — I700 Atherosclerosis of aorta: Secondary | ICD-10-CM | POA: Diagnosis not present

## 2023-10-13 DIAGNOSIS — M8580 Other specified disorders of bone density and structure, unspecified site: Secondary | ICD-10-CM | POA: Diagnosis not present

## 2023-10-13 DIAGNOSIS — Z0000 Encounter for general adult medical examination without abnormal findings: Secondary | ICD-10-CM | POA: Diagnosis not present

## 2023-10-13 DIAGNOSIS — Z6832 Body mass index (BMI) 32.0-32.9, adult: Secondary | ICD-10-CM | POA: Diagnosis not present

## 2023-10-13 DIAGNOSIS — J449 Chronic obstructive pulmonary disease, unspecified: Secondary | ICD-10-CM | POA: Diagnosis not present

## 2023-10-15 DIAGNOSIS — H5203 Hypermetropia, bilateral: Secondary | ICD-10-CM | POA: Diagnosis not present

## 2023-10-15 DIAGNOSIS — Z0100 Encounter for examination of eyes and vision without abnormal findings: Secondary | ICD-10-CM | POA: Diagnosis not present

## 2023-10-15 DIAGNOSIS — H52223 Regular astigmatism, bilateral: Secondary | ICD-10-CM | POA: Diagnosis not present

## 2023-10-15 DIAGNOSIS — H524 Presbyopia: Secondary | ICD-10-CM | POA: Diagnosis not present

## 2023-10-15 DIAGNOSIS — H2513 Age-related nuclear cataract, bilateral: Secondary | ICD-10-CM | POA: Diagnosis not present

## 2023-10-27 DIAGNOSIS — Z6831 Body mass index (BMI) 31.0-31.9, adult: Secondary | ICD-10-CM | POA: Diagnosis not present

## 2023-10-27 DIAGNOSIS — E669 Obesity, unspecified: Secondary | ICD-10-CM | POA: Diagnosis not present

## 2023-10-27 DIAGNOSIS — I700 Atherosclerosis of aorta: Secondary | ICD-10-CM | POA: Diagnosis not present

## 2023-12-15 DIAGNOSIS — Z6830 Body mass index (BMI) 30.0-30.9, adult: Secondary | ICD-10-CM | POA: Diagnosis not present

## 2023-12-15 DIAGNOSIS — E66811 Obesity, class 1: Secondary | ICD-10-CM | POA: Diagnosis not present

## 2023-12-15 DIAGNOSIS — I700 Atherosclerosis of aorta: Secondary | ICD-10-CM | POA: Diagnosis not present

## 2023-12-22 DIAGNOSIS — L814 Other melanin hyperpigmentation: Secondary | ICD-10-CM | POA: Diagnosis not present

## 2023-12-22 DIAGNOSIS — L821 Other seborrheic keratosis: Secondary | ICD-10-CM | POA: Diagnosis not present

## 2023-12-22 DIAGNOSIS — Z85828 Personal history of other malignant neoplasm of skin: Secondary | ICD-10-CM | POA: Diagnosis not present

## 2023-12-22 DIAGNOSIS — D692 Other nonthrombocytopenic purpura: Secondary | ICD-10-CM | POA: Diagnosis not present

## 2023-12-22 DIAGNOSIS — D225 Melanocytic nevi of trunk: Secondary | ICD-10-CM | POA: Diagnosis not present

## 2023-12-22 DIAGNOSIS — I788 Other diseases of capillaries: Secondary | ICD-10-CM | POA: Diagnosis not present

## 2023-12-22 DIAGNOSIS — D235 Other benign neoplasm of skin of trunk: Secondary | ICD-10-CM | POA: Diagnosis not present

## 2023-12-22 DIAGNOSIS — B351 Tinea unguium: Secondary | ICD-10-CM | POA: Diagnosis not present

## 2024-01-19 DIAGNOSIS — I700 Atherosclerosis of aorta: Secondary | ICD-10-CM | POA: Diagnosis not present

## 2024-01-19 DIAGNOSIS — E66811 Obesity, class 1: Secondary | ICD-10-CM | POA: Diagnosis not present

## 2024-01-19 DIAGNOSIS — Z6830 Body mass index (BMI) 30.0-30.9, adult: Secondary | ICD-10-CM | POA: Diagnosis not present

## 2024-02-16 DIAGNOSIS — Z8639 Personal history of other endocrine, nutritional and metabolic disease: Secondary | ICD-10-CM | POA: Diagnosis not present

## 2024-02-16 DIAGNOSIS — I700 Atherosclerosis of aorta: Secondary | ICD-10-CM | POA: Diagnosis not present

## 2024-02-16 DIAGNOSIS — Z6829 Body mass index (BMI) 29.0-29.9, adult: Secondary | ICD-10-CM | POA: Diagnosis not present

## 2024-02-16 DIAGNOSIS — E663 Overweight: Secondary | ICD-10-CM | POA: Diagnosis not present

## 2024-02-22 ENCOUNTER — Other Ambulatory Visit: Payer: Self-pay

## 2024-02-22 ENCOUNTER — Encounter (HOSPITAL_BASED_OUTPATIENT_CLINIC_OR_DEPARTMENT_OTHER): Payer: Self-pay

## 2024-02-22 ENCOUNTER — Emergency Department (HOSPITAL_BASED_OUTPATIENT_CLINIC_OR_DEPARTMENT_OTHER)

## 2024-02-22 ENCOUNTER — Emergency Department (HOSPITAL_BASED_OUTPATIENT_CLINIC_OR_DEPARTMENT_OTHER)
Admission: EM | Admit: 2024-02-22 | Discharge: 2024-02-22 | Disposition: A | Discharge status: Home / Self Care | Attending: Emergency Medicine | Admitting: Emergency Medicine

## 2024-02-22 DIAGNOSIS — R000 Tachycardia, unspecified: Secondary | ICD-10-CM | POA: Diagnosis not present

## 2024-02-22 DIAGNOSIS — T699XXA Effect of reduced temperature, unspecified, initial encounter: Secondary | ICD-10-CM | POA: Diagnosis not present

## 2024-02-22 DIAGNOSIS — R569 Unspecified convulsions: Secondary | ICD-10-CM | POA: Insufficient documentation

## 2024-02-22 DIAGNOSIS — R059 Cough, unspecified: Secondary | ICD-10-CM | POA: Insufficient documentation

## 2024-02-22 DIAGNOSIS — D329 Benign neoplasm of meninges, unspecified: Secondary | ICD-10-CM | POA: Diagnosis not present

## 2024-02-22 DIAGNOSIS — J449 Chronic obstructive pulmonary disease, unspecified: Secondary | ICD-10-CM | POA: Diagnosis not present

## 2024-02-22 DIAGNOSIS — Z5329 Procedure and treatment not carried out because of patient's decision for other reasons: Secondary | ICD-10-CM | POA: Insufficient documentation

## 2024-02-22 DIAGNOSIS — I700 Atherosclerosis of aorta: Secondary | ICD-10-CM | POA: Diagnosis not present

## 2024-02-22 DIAGNOSIS — I672 Cerebral atherosclerosis: Secondary | ICD-10-CM | POA: Diagnosis not present

## 2024-02-22 DIAGNOSIS — J069 Acute upper respiratory infection, unspecified: Secondary | ICD-10-CM | POA: Diagnosis not present

## 2024-02-22 DIAGNOSIS — Z7951 Long term (current) use of inhaled steroids: Secondary | ICD-10-CM | POA: Diagnosis not present

## 2024-02-22 DIAGNOSIS — R55 Syncope and collapse: Secondary | ICD-10-CM | POA: Insufficient documentation

## 2024-02-22 DIAGNOSIS — R0902 Hypoxemia: Secondary | ICD-10-CM | POA: Diagnosis not present

## 2024-02-22 LAB — RESP PANEL BY RT-PCR (RSV, FLU A&B, COVID)  RVPGX2
Influenza A by PCR: NEGATIVE
Influenza B by PCR: NEGATIVE
Resp Syncytial Virus by PCR: NEGATIVE
SARS Coronavirus 2 by RT PCR: NEGATIVE

## 2024-02-22 LAB — CBG MONITORING, ED: Glucose-Capillary: 84 mg/dL (ref 70–99)

## 2024-02-22 LAB — COMPREHENSIVE METABOLIC PANEL
ALT: 19 U/L (ref 0–44)
AST: 20 U/L (ref 15–41)
Albumin: 3.7 g/dL (ref 3.5–5.0)
Alkaline Phosphatase: 42 U/L (ref 38–126)
Anion gap: 8 (ref 5–15)
BUN: 14 mg/dL (ref 8–23)
CO2: 24 mmol/L (ref 22–32)
Calcium: 8.2 mg/dL — ABNORMAL LOW (ref 8.9–10.3)
Chloride: 108 mmol/L (ref 98–111)
Creatinine, Ser: 0.72 mg/dL (ref 0.44–1.00)
GFR, Estimated: >60 mL/min (ref >60)
Glucose, Bld: 88 mg/dL (ref 70–99)
Potassium: 4.1 mmol/L (ref 3.5–5.1)
Sodium: 140 mmol/L (ref 135–145)
Total Bilirubin: 0.5 mg/dL (ref 0.0–1.2)
Total Protein: 5.7 g/dL — ABNORMAL LOW (ref 6.5–8.1)

## 2024-02-22 LAB — CBC WITH DIFFERENTIAL/PLATELET
Abs Immature Granulocytes: 0.04 10*3/uL (ref 0.00–0.07)
Basophils Absolute: 0 10*3/uL (ref 0.0–0.1)
Basophils Relative: 0 %
Eosinophils Absolute: 0 10*3/uL (ref 0.0–0.5)
Eosinophils Relative: 0 %
HCT: 41.7 % (ref 36.0–46.0)
Hemoglobin: 13.6 g/dL (ref 12.0–15.0)
Immature Granulocytes: 0 %
Lymphocytes Relative: 12 %
Lymphs Abs: 1.4 10*3/uL (ref 0.7–4.0)
MCH: 29.2 pg (ref 26.0–34.0)
MCHC: 32.6 g/dL (ref 30.0–36.0)
MCV: 89.5 fL (ref 80.0–100.0)
Monocytes Absolute: 0.8 10*3/uL (ref 0.1–1.0)
Monocytes Relative: 7 %
Neutro Abs: 9.4 10*3/uL — ABNORMAL HIGH (ref 1.7–7.7)
Neutrophils Relative %: 81 %
Platelets: 210 10*3/uL (ref 150–400)
RBC: 4.66 MIL/uL (ref 3.87–5.11)
RDW: 12.9 % (ref 11.5–15.5)
WBC: 11.7 10*3/uL — ABNORMAL HIGH (ref 4.0–10.5)
nRBC: 0 % (ref 0.0–0.2)

## 2024-02-22 LAB — URINALYSIS, ROUTINE W REFLEX MICROSCOPIC
Bacteria, UA: NONE SEEN
Bilirubin Urine: NEGATIVE
Glucose, UA: NEGATIVE mg/dL
Hgb urine dipstick: NEGATIVE
Ketones, ur: NEGATIVE mg/dL
Leukocytes,Ua: NEGATIVE
Nitrite: NEGATIVE
Specific Gravity, Urine: 1.012 (ref 1.005–1.030)
pH: 5.5 (ref 5.0–8.0)

## 2024-02-22 LAB — TROPONIN I (HIGH SENSITIVITY): Troponin I (High Sensitivity): 5 ng/L (ref <18)

## 2024-02-22 LAB — BRAIN NATRIURETIC PEPTIDE: B Natriuretic Peptide: 27.4 pg/mL (ref 0.0–100.0)

## 2024-02-22 LAB — D-DIMER, QUANTITATIVE: D-Dimer, Quant: 0.27 ug{FEU}/mL (ref 0.00–0.50)

## 2024-02-22 MED ORDER — SODIUM CHLORIDE 0.9 % IV BOLUS
1000.0000 mL | Freq: Once | INTRAVENOUS | Status: AC
  Administered 2024-02-22: 1000 mL via INTRAVENOUS

## 2024-02-22 MED ORDER — LEVETIRACETAM IN NACL 500 MG/100ML IV SOLN
500.0000 mg | Freq: Once | INTRAVENOUS | Status: AC
  Administered 2024-02-22: 500 mg via INTRAVENOUS
  Filled 2024-02-22: qty 100

## 2024-02-22 MED ORDER — LEVETIRACETAM 500 MG PO TABS: 500.0000 mg | ORAL_TABLET | Freq: Two times a day (BID) | ORAL | 0 refills | Status: DC

## 2024-02-22 MED ORDER — LEVETIRACETAM IN NACL 1000 MG/100ML IV SOLN
1000.0000 mg | Freq: Once | INTRAVENOUS | Status: AC
  Administered 2024-02-22: 1000 mg via INTRAVENOUS
  Filled 2024-02-22: qty 100

## 2024-02-22 MED ORDER — LORAZEPAM 2 MG/ML IJ SOLN: 0.5000 mg | INTRAMUSCULAR | Status: DC | PRN

## 2024-02-22 MED ORDER — SODIUM CHLORIDE 0.9 % IV SOLN: 20.0000 mg/kg | Freq: Once | INTRAVENOUS | Status: DC

## 2024-02-22 NOTE — ED Notes (Signed)
 Called [facility] for transport to [facility] ED; accepting physician, Beckey Downing, MD

## 2024-02-22 NOTE — ED Notes (Signed)
 [facility] at bedside

## 2024-02-22 NOTE — ED Provider Notes (Signed)
 Assumed care of the patient at 1500.  Briefly patient with episode of loss of consciousness, found to have meningioma on CT, plan for MRI.  Likely d/c home on keppra.   I was notified by nursing that the patient had eloped from the department.  I put in a order for neurology to try and contact her to set up an urgent appointment.  Will write a prescription for Keppra to her pharmacy.   Melene Plan, DO 02/22/24 [phone]

## 2024-02-22 NOTE — ED Notes (Signed)
 RT assessed the Pt and her lungs were clear and vitals were normal

## 2024-02-22 NOTE — ED Triage Notes (Signed)
 Pt via [facility]/ [facility] from home, past 4 days "URI situation." According to family, this AM, had near-syncopal episode. Per report, husband states that she has had "multiple episodes, not eating well."  126/80 HR 90 20g L wrist, approx in

## 2024-02-22 NOTE — ED Notes (Signed)
 Pt very agitated about MRI wait times. Pt requesting to leave. MD notified.

## 2024-02-22 NOTE — ED Triage Notes (Signed)
 Pt here as transfer from [facility]. Husband witnessed syncope episode. No hx of seizures. Pt incontinent of urine when episode happened and lasted 10-15 min. Axox4. [facility] gvae 1500 of Keppra and here for MRI.

## 2024-02-22 NOTE — ED Notes (Signed)
 RT assessed the Pt and she was clear and her vitals were

## 2024-02-22 NOTE — ED Provider Notes (Signed)
 [place] EMERGENCY DEPARTMENT AT [facility] Provider Note   CSN: [id number] Arrival date & time: 02/22/24  4098     History  Chief Complaint  Patient presents with   Near Syncope    Grace Nelson is a 73 y.o. female.   Near Syncope     73 year old female with medical history significant for depression, COPD presenting to the emergency department with a chief complaint of unresponsiveness at home.  The history is provided by the patient as well as the patient's husband.  The patient has had roughly 3 to 4 days of URI symptoms with nasal congestion and drainage and a cough.  This morning, the patient was seated at a table when she syncopized and went unresponsive.  Her head slumped forward.  She had some twitching at the corners of her mouth, no generalized seizure activity.  She was unresponsive for a total of around 10 to 15 minutes per her husband.  She then came to.  No significant prodrome, no nausea, vomiting, lightheadedness, no chest pain or shortness of breath.  She has not been eating well over the past few days has had decreased oral intake.    On further eval by HPI, it appears the patient had an episode of urinary incontinence during this event.  She had twitching of her mouth but no generalized whole tonic-clonic seizure activity.  She is amnestic to the event and was unresponsive during it.  No tongue biting.  No prior history of seizures.  Home Medications Prior to Admission medications   Medication Sig Start Date End Date Taking? Authorizing Provider  acetaminophen (TYLENOL) 650 MG CR tablet Take 650 mg by mouth every 8 (eight) hours as needed for pain.    [provider]  albuterol (VENTOLIN HFA) 108 (90 Base) MCG/ACT inhaler Inhale 2 puffs into the lungs every 6 (six) hours as needed for wheezing or shortness of breath. Use 3 times daily x 4 days then every 6 hours as needed. 08/29/22   Rodolph Bong, MD  cetirizine (ZYRTEC) 10 MG tablet  Take 10 mg by mouth daily.    [provider]  fluticasone (FLONASE) 50 MCG/ACT nasal spray Place 2 sprays into both nostrils daily. 08/30/22   Rodolph Bong, MD  Fluticasone-Umeclidin-Vilant (TRELEGY ELLIPTA) 100-62.5-25 MCG/ACT AEPB Inhale 1 puff into the lungs daily. 10/03/23   Tomma Lightning, MD  nicotine polacrilex (NICORETTE) 2 MG gum Chew 1 piece (2 mg total) by mouth when needed as directed for smoking cessation 08/29/22   Rodolph Bong, MD  pantoprazole (PROTONIX) 40 MG tablet Take 1 tablet (40 mg total) by mouth daily at 6 (six) AM. Patient not taking: Reported on 11/17/2022 08/30/22   Rodolph Bong, MD  simvastatin (ZOCOR) 20 MG tablet Take 20 mg by mouth at bedtime. 06/13/22   [provider]  VITAMIN D, CHOLECALCIFEROL, PO Take 1 tablet by mouth daily.    [provider]      Allergies    Nsaids and Sulfa antibiotics    Review of Systems   Review of Systems  Cardiovascular:  Positive for near-syncope.  All other systems reviewed and are negative.   Physical Exam Updated Vital Signs BP 123/64   Pulse 69   Temp 98.4 F (36.9 C)   Resp 15   SpO2 96%  Physical Exam Vitals and nursing note reviewed.  Constitutional:      General: She is not in acute distress.    Appearance: She  is well-developed.  HENT:     Head: Normocephalic and atraumatic.  Eyes:     Conjunctiva/sclera: Conjunctivae normal.  Cardiovascular:     Rate and Rhythm: Normal rate and regular rhythm.     Pulses: Normal pulses.     Heart sounds: No murmur heard. Pulmonary:     Effort: Pulmonary effort is normal. No respiratory distress.     Breath sounds: Normal breath sounds.  Abdominal:     Palpations: Abdomen is soft.     Tenderness: There is no abdominal tenderness.  Musculoskeletal:        General: No swelling.     Cervical back: Neck supple.  Skin:    General: Skin is warm and dry.     Capillary Refill: Capillary refill takes less than 2 seconds.   Neurological:     Mental Status: She is alert.     Comments: MENTAL STATUS EXAM:    Orientation: Alert and oriented to person, place and time.  Memory: Cooperative, follows commands well.  Language: Speech is clear and language is normal.   CRANIAL NERVES:    CN 2 (Optic): Visual fields intact to confrontation.  CN 3,4,6 (EOM): Pupils equal and reactive to light. Full extraocular eye movement without nystagmus.  CN 5 (Trigeminal): Facial sensation is normal, no weakness of masticatory muscles.  CN 7 (Facial): No facial weakness or asymmetry.  CN 8 (Auditory): Auditory acuity grossly normal.  CN 9,10 (Glossophar): The uvula is midline, the palate elevates symmetrically.  CN 11 (spinal access): Normal sternocleidomastoid and trapezius strength.  CN 12 (Hypoglossal): The tongue is midline. No atrophy or fasciculations.Marland Kitchen   MOTOR:  Muscle Strength: 5/5RUE, 5/5LUE, 5/5RLE, 5/5LLE.   COORDINATION:   No tremor.   SENSATION:   Intact to light touch all four extremities.    Psychiatric:        Mood and Affect: Mood normal.     ED Results / Procedures / Treatments   Labs (all labs ordered are listed, but only abnormal results are displayed) Labs Reviewed  CBC WITH DIFFERENTIAL/PLATELET - Abnormal; Notable for the following components:      Result Value   WBC 11.7 (*)    Neutro Abs 9.4 (*)    All other components within normal limits  COMPREHENSIVE METABOLIC PANEL - Abnormal; Notable for the following components:   Calcium 8.2 (*)    Total Protein 5.7 (*)    All other components within normal limits  URINALYSIS, ROUTINE W REFLEX MICROSCOPIC - Abnormal; Notable for the following components:   APPearance HAZY (*)    Protein, ur TRACE (*)    All other components within normal limits  RESP PANEL BY RT-PCR (RSV, FLU A&B, COVID)  RVPGX2  BRAIN NATRIURETIC PEPTIDE  D-DIMER, QUANTITATIVE  CBG MONITORING, ED  TROPONIN I (HIGH SENSITIVITY)    EKG EKG  Interpretation Date/Time:  Monday February 22 2024 09:47:38 EDT Ventricular Rate:  74 PR Interval:  152 QRS Duration:  92 QT Interval:  361 QTC Calculation: 401 R Axis:   65  Text Interpretation: Age not entered, assumed to be  73 years old for purpose of ECG interpretation Sinus rhythm Atrial premature complexes Confirmed by Ernie Avena ([id number]) on 02/22/2024 12:37:11 PM  Radiology DG Chest Port 1 View Result Date: 02/22/2024 CLINICAL DATA:  Near syncopal episode. Upper respiratory infection symptoms for 4 days. EXAM: PORTABLE CHEST 1 VIEW COMPARISON:  Radiographs 08/28/2022 and 08/25/2022.  CT 03/27/2023. FINDINGS: 1042 hours. Two views submitted. The heart size and  mediastinal contours are stable with aortic atherosclerosis. Stable chronic central airway and interstitial thickening at both lung bases. No edema, confluent airspace disease, pneumothorax or significant pleural effusion. The bones appear unremarkable. Telemetry leads overlie the chest. IMPRESSION: Stable chest.  No evidence of acute cardiopulmonary process. Electronically Signed   By: Carey Bullocks M.D.   On: 02/22/2024 11:56   CT Head Wo Contrast Result Date: 02/22/2024 CLINICAL DATA:  Syncope/presyncope, cerebrovascular cause suspected. EXAM: CT HEAD WITHOUT CONTRAST TECHNIQUE: Contiguous axial images were obtained from the base of the skull through the vertex without intravenous contrast. RADIATION DOSE REDUCTION: This exam was performed according to the departmental dose-optimization program which includes automated exposure control, adjustment of the mA and/or kV according to patient size and/or use of iterative reconstruction technique. COMPARISON:  None Available. FINDINGS: Brain: The brain shows a normal appearance without evidence of malformation, atrophy, old or acute small or large vessel infarction, intra-axial mass lesion, hemorrhage, hydrocephalus or extra-axial collection. Left frontal convexity 15 x 17 mm calcified  meningioma without significant mass-effect upon the brain. Likely incidental. Vascular: There is atherosclerotic calcification of the major vessels at the base of the brain. Skull: Normal.  No traumatic finding.  No focal bone lesion. Sinuses/Orbits: Sinuses are clear. Orbits appear normal. Mastoids are clear. Other: None significant IMPRESSION: 1. No acute CT finding. 2. Left frontal convexity 15 x 17 mm calcified meningioma without significant mass-effect upon the brain. Likely incidental. 3. Atherosclerotic calcification of the major vessels at the base of the brain. Electronically Signed   By: Paulina Fusi M.D.   On: 02/22/2024 11:19    Procedures Procedures    Medications Ordered in ED Medications  levETIRAcetam (KEPPRA) 1,720 mg in sodium chloride 0.9 % 100 mL IVPB (has no administration in time range)  LORazepam (ATIVAN) injection 0.5 mg (has no administration in time range)  sodium chloride 0.9 % bolus 1,000 mL (1,000 mLs Intravenous New Bag/Given 02/22/24 1033)    ED Course/ Medical Decision Making/ A&P                                 Medical Decision Making Amount and/or Complexity of Data Reviewed Labs: ordered. Radiology: ordered.  Risk Prescription drug management.    73 year old female with medical history significant for depression, COPD presenting to the emergency department with a chief complaint of unresponsiveness at home.  The history is provided by the patient as well as the patient's husband.  The patient has had roughly 3 to 4 days of URI symptoms with nasal congestion and drainage and a cough.  This morning, the patient was seated at a table when she syncopized and went unresponsive.  Her head slumped forward.  She had some twitching at the corners of her mouth, no generalized seizure activity.  She was unresponsive for a total of around 10 to 15 minutes per her husband.  She then came to.  No significant prodrome, no nausea, vomiting, lightheadedness, no chest pain  or shortness of breath.  She has not been eating well over the past few days has had decreased oral intake.    On further eval by HPI, it appears the patient had an episode of urinary incontinence during this event.  She had twitching of her mouth but no generalized whole tonic-clonic seizure activity.  She is amnestic to the event and was unresponsive during it.  No tongue biting.  No prior history of seizures.  Medical Decision Making:   Grace Nelson is a 73 y.o. female who presented to the ED today with a syncopal episode detailed above.    Patient placed on continuous vitals and telemetry monitoring while in ED which was reviewed periodically.  Complete initial physical exam performed, notably the patient  was CTAB, neuro intact.    Reviewed and confirmed nursing documentation for past medical history, family history, social history.    Initial Assessment:   With the patient's presentation of syncope, most likely diagnosis is cardiac etiology, hypotension/hypovolemia, seizure. Also considered orthostatic hypotension vs vasovagal episode. Other diagnoses were considered including (but not limited to) valvular abnormality, PE, aortic dissection. These are considered less likely due to history of present illness and physical exam findings.   This is most consistent with an acute life/limb threatening illness complicated by underlying chronic conditions.   Initial Plan:  CT Head  Screening labs including CBC and Metabolic panel to evaluate for infectious or metabolic etiology of disease.  Dimer, BNP, troponin Urinalysis with reflex culture ordered to evaluate for UTI or relevant urologic/nephrologic pathology.  CXR to evaluate for structural/infectious intrathoracic pathology.  EKG to evaluate for cardiac pathology.  Objective evaluation as below reviewed after administration of IVF/Telemetry monitoring  Initial Study Results:   Laboratory  All laboratory results reviewed without  evidence of clinically relevant pathology.   Exceptions include: mild leukocytosis to 11.7.   EKG EKG was reviewed independently. Rate, rhythm, axis, intervals all examined and without medically relevant abnormality. ST segments without concerns for elevations.  No HOCM, WPW, Brugada.  Radiology:  All images reviewed independently. Agree with radiology report at this time.   DG Chest Port 1 View Result Date: 02/22/2024 CLINICAL DATA:  Near syncopal episode. Upper respiratory infection symptoms for 4 days. EXAM: PORTABLE CHEST 1 VIEW COMPARISON:  Radiographs 08/28/2022 and 08/25/2022.  CT 03/27/2023. FINDINGS: 1042 hours. Two views submitted. The heart size and mediastinal contours are stable with aortic atherosclerosis. Stable chronic central airway and interstitial thickening at both lung bases. No edema, confluent airspace disease, pneumothorax or significant pleural effusion. The bones appear unremarkable. Telemetry leads overlie the chest. IMPRESSION: Stable chest.  No evidence of acute cardiopulmonary process. Electronically Signed   By: Carey Bullocks M.D.   On: 02/22/2024 11:56   CT Head Wo Contrast Result Date: 02/22/2024 CLINICAL DATA:  Syncope/presyncope, cerebrovascular cause suspected. EXAM: CT HEAD WITHOUT CONTRAST TECHNIQUE: Contiguous axial images were obtained from the base of the skull through the vertex without intravenous contrast. RADIATION DOSE REDUCTION: This exam was performed according to the departmental dose-optimization program which includes automated exposure control, adjustment of the mA and/or kV according to patient size and/or use of iterative reconstruction technique. COMPARISON:  None Available. FINDINGS: Brain: The brain shows a normal appearance without evidence of malformation, atrophy, old or acute small or large vessel infarction, intra-axial mass lesion, hemorrhage, hydrocephalus or extra-axial collection. Left frontal convexity 15 x 17 mm calcified meningioma  without significant mass-effect upon the brain. Likely incidental. Vascular: There is atherosclerotic calcification of the major vessels at the base of the brain. Skull: Normal.  No traumatic finding.  No focal bone lesion. Sinuses/Orbits: Sinuses are clear. Orbits appear normal. Mastoids are clear. Other: None significant IMPRESSION: 1. No acute CT finding. 2. Left frontal convexity 15 x 17 mm calcified meningioma without significant mass-effect upon the brain. Likely incidental. 3. Atherosclerotic calcification of the major vessels at the base of the brain. Electronically Signed   By: Paulina Fusi  M.D.   On: 02/22/2024 11:19    Pt found to have new diagnosis of brain meningioma. Considered new onset seizure in the setting of facial twitching, unresponsiveness, urinary incontinence.   Consults: Case discussed with on-call neurology due to potential concern for new onset seizure.   Final Assessment and Plan:   Discussed with the patient the plan.  Neurology, Dr. Selina Cooley was consulted and recommended ER to ER transfer for MRI imaging in the setting of new onset seizure and likely meningioma to rule out stroke or metastatic disease.  Will load the patient with 20 mg/kg of Keppra IV.  She will need to be discharged on AED.  Dr. Rhae Hammock of [facility], ER accepted the patient in ER to ER transfer, [facility] notified for transfer and patient and family updated regarding the plan of care.    Clinical Impression:  1. New onset seizure (HCC)   2. Meningioma [facility])      Transfer via Transport     Final Clinical Impression(s) / ED Diagnoses Final diagnoses:  New onset seizure (HCC)  Meningioma [facility])    Rx / DC Orders ED Discharge Orders     None         Ernie Avena, MD 02/22/24 1308

## 2024-02-23 DIAGNOSIS — R55 Syncope and collapse: Secondary | ICD-10-CM | POA: Diagnosis not present

## 2024-02-23 DIAGNOSIS — Z6830 Body mass index (BMI) 30.0-30.9, adult: Secondary | ICD-10-CM | POA: Diagnosis not present

## 2024-02-24 ENCOUNTER — Ambulatory Visit: Admitting: Neurology

## 2024-02-24 ENCOUNTER — Emergency Department (HOSPITAL_COMMUNITY)

## 2024-02-24 ENCOUNTER — Other Ambulatory Visit: Payer: Self-pay

## 2024-02-24 ENCOUNTER — Observation Stay (HOSPITAL_COMMUNITY)

## 2024-02-24 ENCOUNTER — Observation Stay (HOSPITAL_COMMUNITY)
Admission: EM | Admit: 2024-02-24 | Discharge: 2024-02-25 | Disposition: A | Discharge status: Home / Self Care | Attending: Internal Medicine | Admitting: Internal Medicine

## 2024-02-24 ENCOUNTER — Encounter (HOSPITAL_COMMUNITY): Payer: Self-pay | Admitting: Internal Medicine

## 2024-02-24 DIAGNOSIS — E669 Obesity, unspecified: Secondary | ICD-10-CM | POA: Diagnosis not present

## 2024-02-24 DIAGNOSIS — R569 Unspecified convulsions: Secondary | ICD-10-CM | POA: Diagnosis not present

## 2024-02-24 DIAGNOSIS — R42 Dizziness and giddiness: Secondary | ICD-10-CM | POA: Diagnosis not present

## 2024-02-24 DIAGNOSIS — W19XXXA Unspecified fall, initial encounter: Secondary | ICD-10-CM | POA: Diagnosis not present

## 2024-02-24 DIAGNOSIS — R55 Syncope and collapse: Secondary | ICD-10-CM | POA: Diagnosis not present

## 2024-02-24 DIAGNOSIS — Z79899 Other long term (current) drug therapy: Secondary | ICD-10-CM | POA: Diagnosis not present

## 2024-02-24 DIAGNOSIS — Z87891 Personal history of nicotine dependence: Secondary | ICD-10-CM | POA: Diagnosis not present

## 2024-02-24 DIAGNOSIS — I6782 Cerebral ischemia: Secondary | ICD-10-CM | POA: Diagnosis not present

## 2024-02-24 DIAGNOSIS — E785 Hyperlipidemia, unspecified: Secondary | ICD-10-CM | POA: Insufficient documentation

## 2024-02-24 DIAGNOSIS — R220 Localized swelling, mass and lump, head: Secondary | ICD-10-CM | POA: Diagnosis not present

## 2024-02-24 DIAGNOSIS — J449 Chronic obstructive pulmonary disease, unspecified: Secondary | ICD-10-CM | POA: Insufficient documentation

## 2024-02-24 DIAGNOSIS — R0902 Hypoxemia: Secondary | ICD-10-CM | POA: Diagnosis not present

## 2024-02-24 DIAGNOSIS — D320 Benign neoplasm of cerebral meninges: Secondary | ICD-10-CM | POA: Diagnosis not present

## 2024-02-24 HISTORY — DX: Chronic obstructive pulmonary disease, unspecified: J44.9

## 2024-02-24 LAB — CBC
HCT: 40.1 % (ref 36.0–46.0)
Hemoglobin: 13 g/dL (ref 12.0–15.0)
MCH: 29.1 pg (ref 26.0–34.0)
MCHC: 32.4 g/dL (ref 30.0–36.0)
MCV: 89.7 fL (ref 80.0–100.0)
Platelets: 209 10*3/uL (ref 150–400)
RBC: 4.47 MIL/uL (ref 3.87–5.11)
RDW: 12.8 % (ref 11.5–15.5)
WBC: 13.1 10*3/uL — ABNORMAL HIGH (ref 4.0–10.5)
nRBC: 0 % (ref 0.0–0.2)

## 2024-02-24 LAB — URINALYSIS, ROUTINE W REFLEX MICROSCOPIC
Glucose, UA: NEGATIVE mg/dL
Hgb urine dipstick: NEGATIVE
Ketones, ur: NEGATIVE mg/dL
Nitrite: NEGATIVE
Protein, ur: NEGATIVE mg/dL
Specific Gravity, Urine: 1.025 (ref 1.005–1.030)
pH: 5.5 (ref 5.0–8.0)

## 2024-02-24 LAB — BASIC METABOLIC PANEL
Anion gap: 8 (ref 5–15)
BUN: 10 mg/dL (ref 8–23)
CO2: 25 mmol/L (ref 22–32)
Calcium: 8.9 mg/dL (ref 8.9–10.3)
Chloride: 107 mmol/L (ref 98–111)
Creatinine, Ser: 0.75 mg/dL (ref 0.44–1.00)
GFR, Estimated: >60 mL/min (ref >60)
Glucose, Bld: 98 mg/dL (ref 70–99)
Potassium: 3.7 mmol/L (ref 3.5–5.1)
Sodium: 140 mmol/L (ref 135–145)

## 2024-02-24 LAB — URINALYSIS, MICROSCOPIC (REFLEX)

## 2024-02-24 LAB — CBG MONITORING, ED: Glucose-Capillary: 89 mg/dL (ref 70–99)

## 2024-02-24 MED ORDER — LEVETIRACETAM 500 MG PO TABS
500.0000 mg | ORAL_TABLET | Freq: Two times a day (BID) | ORAL | Status: DC
  Administered 2024-02-24 – 2024-02-25 (×2): 500 mg via ORAL
  Filled 2024-02-24 (×2): qty 1

## 2024-02-24 MED ORDER — ACETAMINOPHEN 325 MG PO TABS: 650.0000 mg | ORAL_TABLET | Freq: Four times a day (QID) | ORAL | Status: DC | PRN

## 2024-02-24 MED ORDER — ENOXAPARIN SODIUM 40 MG/0.4ML IJ SOSY: 40.0000 mg | PREFILLED_SYRINGE | INTRAMUSCULAR | Status: DC

## 2024-02-24 MED ORDER — HYDRALAZINE HCL 20 MG/ML IJ SOLN: 10.0000 mg | Freq: Four times a day (QID) | INTRAMUSCULAR | Status: DC | PRN

## 2024-02-24 MED ORDER — GADOBUTROL 1 MMOL/ML IV SOLN
7.5000 mL | Freq: Once | INTRAVENOUS | Status: AC | PRN
  Administered 2024-02-24: 7.5 mL via INTRAVENOUS

## 2024-02-24 MED ORDER — SODIUM CHLORIDE 0.9 % IV SOLN: INTRAVENOUS | Status: DC

## 2024-02-24 MED ORDER — ALBUTEROL SULFATE (2.5 MG/3ML) 0.083% IN NEBU: 2.5000 mg | INHALATION_SOLUTION | Freq: Four times a day (QID) | RESPIRATORY_TRACT | Status: DC | PRN

## 2024-02-24 MED ORDER — POLYETHYLENE GLYCOL 3350 17 G PO PACK: 17.0000 g | PACK | Freq: Every day | ORAL | Status: DC | PRN

## 2024-02-24 MED ORDER — BISACODYL 5 MG PO TBEC: 5.0000 mg | DELAYED_RELEASE_TABLET | Freq: Every day | ORAL | Status: DC | PRN

## 2024-02-24 MED ORDER — ACETAMINOPHEN 650 MG RE SUPP: 650.0000 mg | Freq: Four times a day (QID) | RECTAL | Status: DC | PRN

## 2024-02-24 NOTE — Procedures (Signed)
 Patient Name: Grace Nelson  MRN: [id number]  Epilepsy Attending: Charlsie Quest  Referring Physician/Provider: Gretta Began  Date:  02/24/2024 Duration: 23.07 mins  Patient history: 73yo F with syncope. EEG to evaluate for seizure  Level of alertness: Awake  AEDs during EEG study: None  Technical aspects: This EEG study was done with scalp electrodes positioned according to the 10-20 International system of electrode placement. Electrical activity was reviewed with band pass filter of 1-70Hz , sensitivity of 7 uV/mm, display speed of 36mm/sec with a 60Hz  notched filter applied as appropriate. EEG data were recorded continuously and digitally stored.  Video monitoring was available and reviewed as appropriate.  Description: The posterior dominant rhythm consists of 9-10 Hz activity of moderate voltage (25-35 uV) seen predominantly in posterior head regions, symmetric and reactive to eye opening and eye closing. Physiologic photic driving was not seen during photic stimulation.  Hyperventilation was not performed.     IMPRESSION: This study is within normal limits. No seizures or epileptiform discharges were seen throughout the recording.  A normal interictal EEG does not exclude the diagnosis of epilepsy.  Hewitt Garner Annabelle Harman

## 2024-02-24 NOTE — H&P (Signed)
 [facility] History and Physical  LEXXUS UNDERHILL ZOX:[id number] DOB: 07-29-1951 DOA: 02/24/2024 PCP: Sigmund Hazel, MD  Presented from: home Chief Complaint: Seizure-like activity  History of Present Illness: Grace Nelson is a 73 y.o. female with PMH significant for COPD, osteopenia, depression Patient was brought to the ED by EMS from home for dizziness, syncopal episodes.  Lives at home, with her husband.  At baseline, able to ambulate without assistive device.  Patient and her husband both state that she drinks very little water.  Denies any recent vomiting, diarrhea. She had 3 to 4 days of URI symptoms last week with some nasal congestion, drainage and cough. 3/24, patient had a syncopal episode at home while seated.  This was witnessed by her husband..  She had twitching of the corners of her mouth, did not have generalized seizure activity.  Had an episode of bowel incontinence.  She remained unresponsive for about 10 to 15 minutes gradually woke up. She was seen at [facility] ED Workup at that time showed unremarkable CBC, BMP, respiratory virus panel, urinalysis CT head showed an incidental finding of a meningioma. She was started on IV Keppra and transferred to [facility], ED for further workup. While at [facility] ED, patient had to wait for hours. She became frustrated and eloped. It seems ED attending had sent a prescription for Keppra to her pharmacy which she was not aware of and hence did not pick up.  She was planning to go for an appointment [facility] 3/[facility].  Today 3/26, patient had another syncopal episode at home.  She was talking to her daughter on the phone, felt dizzy and passed out.  No report of convulsions. EMS noted blood pressure low in 70s, brought her to ED  In the ED, patient was afebrile, heart rate mostly in 50s, blood pressure in normal range, breathing on room air Labs with WBC elevated to 13.1, otherwise unremarkable  CBC, BMP Urinalysis with cloudy yellow color urine with trace leukocytes and rare bacteria MRI brain did not show any acute intracranial abnormality.  It showed 2 cm meningioma over the anterior left frontal convexity and 1 cm meningioma along the left anterior clinoid process without significant mass effect or edema.  EDP discussed with the neurologist at [facility] who she was supposed to follow up with today. Recommended EEG and continuation of Keppra Hospitalist service was consulted for inpatient management.  At the time of my evaluation, patient was propped up in bed.  Not in distress. Alert, awake, oriented x 3. Husband at bedside. History reviewed and detailed as above. Patient asked more than one time if she could be discharged today.  Understands the need of further workup and overnight monitoring at least.  Review of Systems:  All systems were reviewed and were negative unless otherwise mentioned in the HPI   Past medical history: Past Medical History:  Diagnosis Date   Depression    Osteopenia     Past surgical history: Past Surgical History:  Procedure Laterality Date   ABDOMINAL HYSTERECTOMY     APPENDECTOMY      Social History:  reports that she quit smoking about 18 months ago. Her smoking use included cigarettes. She started smoking about 46 years ago. She has never used smokeless tobacco. She reports that she does not drink alcohol and does not use drugs.  Allergies:  Allergies  Allergen Reactions   Nsaids    Sulfa Antibiotics Hives   Nicotine Rash  Patches   Nsaids, Sulfa antibiotics, and Nicotine   Family history:  Family History  Problem Relation Age of Onset   Ulcers Mother        pancreatic bleeding ulcer   Hypertension Mother    Cancer Mother        colon   COPD Father    Cancer Father        lung   Heart disease Father        before age 38   Heart attack Father    Kidney disease Brother      Physical  Exam: Vitals:   02/24/24 1130 02/24/24 1330 02/24/24 1332 02/24/24 1639  BP: 110/77 135/78  133/70  Pulse: (!) 105 (!) 58  84  Resp: 15 18  20   Temp:   98.3 F (36.8 C) 98.3 F (36.8 C)  TempSrc:   Oral Oral  SpO2: 92% 90%  94%  Weight:      Height:       Wt Readings from Last 3 Encounters:  02/24/24 78 kg  07/10/23 86.9 kg  11/17/22 86.6 kg   Body mass index is 31.46 kg/m.  General exam: Pleasant, elderly Caucasian female.  Looks dry overall Skin: No rashes, lesions or ulcers. HEENT: Atraumatic, normocephalic, no obvious bleeding Lungs: Clear to auscultation bilaterally,  CVS: S1, S2, no murmur,   GI/Abd: Soft, nontender, nondistended, bowel sound present,   CNS: Alert, awake, oriented x 3 Psychiatry: Mood appropriate,  Extremities: No pedal edema, no calf tenderness,    ----------------------------------------------------------------------------------------------------------------------------------------- ----------------------------------------------------------------------------------------------------------------------------------------- -----------------------------------------------------------------------------------------------------------------------------------------  Assessment/Plan: Principal Problem:   Seizure (HCC)  Syncope/seizure Brought in for 2 episodes of syncope followed by seizure-like activity No prior history of seizure or cardiac issues Overall looks dry.  Patient admits to drinking little water for several years Not on any mood altering medication or diuretics Blood pressure with EMS today was in 70s but after that it has been in normal range without IV hydration Although MRI brain showed meningioma, it is not pressing on the parenchyma causing mass effect. At this time, I am not sure if patient had true seizure or seizure-like activity after a syncope from hypotension. Obtain echocardiogram, EEG Monitor in telemetry Obtain orthostatic  vital signs Start IVF with normal saline at 100 mL/h overnight While workup in progress, I would continue Keppra 5 mg twice daily as recommended by neuro to EDP.  COPD Respiratory status stable  HLD Statin  Home med list has not been obtained/updated by [facility] at the time of admission.  Please double check admission reconciliation.   Mobility: Encourage ambulation  Goals of care:   Code Status: Limited: Do not attempt resuscitation (DNR) -DNR-LIMITED -Do Not Intubate/DNI   Patient verbalized her wish of DNR/DNI at the presence of her husband   DVT prophylaxis:  enoxaparin (LOVENOX) injection 40 mg Start: 02/25/24 0800   Antimicrobials: None Fluid: NS at 100 mL/h Consultants: None Family Communication: Husband at bedside  Status: Observation Level of care:  Telemetry Medical   Patient is from: Home Anticipated d/c to: Home eventually  Diet: Diet Order             Diet regular Room service appropriate? Yes; Fluid consistency: Thin  Diet effective now                    ------------------------------------------------------------------------------------- Severity of Illness: The appropriate patient status for this patient is OBSERVATION. Observation status is judged to be reasonable and necessary in order to provide the required  intensity of service to ensure the patient's safety. The patient's presenting symptoms, physical exam findings, and initial radiographic and laboratory data in the context of their medical condition is felt to place them at decreased risk for further clinical deterioration. Furthermore, it is anticipated that the patient will be medically stable for discharge from the hospital within 2 midnights of admission.  -------------------------------------------------------------------------------------   Home Meds: Prior to Admission medications   Medication Sig Start Date End Date Taking? Authorizing Provider  acetaminophen (TYLENOL) 650 MG CR  tablet Take 650 mg by mouth every 8 (eight) hours as needed for pain.    [provider]  albuterol (VENTOLIN HFA) 108 (90 Base) MCG/ACT inhaler Inhale 2 puffs into the lungs every 6 (six) hours as needed for wheezing or shortness of breath. Use 3 times daily x 4 days then every 6 hours as needed. 08/29/22   Rodolph Bong, MD  cetirizine (ZYRTEC) 10 MG tablet Take 10 mg by mouth daily.    [provider]  fluticasone (FLONASE) 50 MCG/ACT nasal spray Place 2 sprays into both nostrils daily. 08/30/22   Rodolph Bong, MD  Fluticasone-Umeclidin-Vilant (TRELEGY ELLIPTA) 100-62.5-25 MCG/ACT AEPB Inhale 1 puff into the lungs daily. 10/03/23   Tomma Lightning, MD  levETIRAcetam (KEPPRA) 500 MG tablet Take 1 tablet (500 mg total) by mouth 2 (two) times daily. 02/22/24   Melene Plan, DO  nicotine polacrilex (NICORETTE) 2 MG gum Chew 1 piece (2 mg total) by mouth when needed as directed for smoking cessation 08/29/22   Rodolph Bong, MD  pantoprazole (PROTONIX) 40 MG tablet Take 1 tablet (40 mg total) by mouth daily at 6 (six) AM. Patient not taking: Reported on 11/17/2022 08/30/22   Rodolph Bong, MD  simvastatin (ZOCOR) 20 MG tablet Take 20 mg by mouth at bedtime. 06/13/22   [provider]  VITAMIN D, CHOLECALCIFEROL, PO Take 1 tablet by mouth daily.    [provider]    Labs on Admission:   CBC: Recent Labs  Lab 02/22/24 1029 02/24/24 1152  WBC 11.7* 13.1*  NEUTROABS 9.4*  --   HGB 13.6 13.0  HCT 41.7 40.1  MCV 89.5 89.7  PLT 210 209    Basic Metabolic Panel: Recent Labs  Lab 02/22/24 1029 02/24/24 1152  NA 140 140  K 4.1 3.7  CL 108 107  CO2 24 25  GLUCOSE 88 98  BUN 14 10  CREATININE 0.72 0.75  CALCIUM 8.2* 8.9    Liver Function Tests: Recent Labs  Lab 02/22/24 1029  AST 20  ALT 19  ALKPHOS 42  BILITOT 0.5  PROT 5.7*  ALBUMIN 3.7   No results for input(s): "LIPASE", "AMYLASE" in the last 168 hours. No results for  input(s): "AMMONIA" in the last 168 hours.  Cardiac Enzymes: No results for input(s): "CKTOTAL", "CKMB", "CKMBINDEX", "TROPONINI" in the last 168 hours.  BNP (last 3 results) Recent Labs    02/22/24 1029  BNP 27.4    ProBNP (last 3 results) No results for input(s): "PROBNP" in the last 8760 hours.  CBG: Recent Labs  Lab 02/22/24 1025 02/24/24 1041  GLUCAP 84 89    Lipase  No results found for: "LIPASE"   Urinalysis    Component Value Date/Time   COLORURINE YELLOW 02/24/2024 1219   APPEARANCEUR CLOUDY (A) 02/24/2024 1219   LABSPEC 1.025 02/24/2024 1219   PHURINE 5.5 02/24/2024 1219   GLUCOSEU NEGATIVE 02/24/2024 1219   HGBUR NEGATIVE 02/24/2024 1219  BILIRUBINUR SMALL (A) 02/24/2024 1219   KETONESUR NEGATIVE 02/24/2024 1219   PROTEINUR NEGATIVE 02/24/2024 1219   NITRITE NEGATIVE 02/24/2024 1219   LEUKOCYTESUR TRACE (A) 02/24/2024 1219     Drugs of Abuse  No results found for: "LABOPIA", "COCAINSCRNUR", "LABBENZ", "AMPHETMU", "THCU", "LABBARB"    Radiological Exams on Admission: MR Brain W and Wo Contrast Result Date: 02/24/2024 CLINICAL DATA:  Seizure, new-onset, no history of trauma. EXAM: MRI HEAD WITHOUT AND WITH CONTRAST TECHNIQUE: Multiplanar, multiecho pulse sequences of the brain and surrounding structures were obtained without and with intravenous contrast. CONTRAST:  7.17mL GADAVIST GADOBUTROL 1 MMOL/ML IV SOLN COMPARISON:  Head CT 02/22/2024 FINDINGS: Brain: There is no evidence of an acute infarct, intracranial hemorrhage, midline shift, or extra-axial fluid collection. Scattered small T2 hyperintensities in the cerebral white matter bilaterally are nonspecific but compatible with mild chronic small vessel ischemic disease. Cerebral volume is normal for age. The ventricles are normal in size. An enhancing, densely calcified extra-axial mass over the anterior left frontal convexity measures 2.0 x 1.4 cm with slight mass effect on the underlying frontal lobe and  no edema. A smaller enhancing, calcified extra-axial mass is located along the left anterior clinoid process and measures 1.0 x 0.6 cm with slight mass effect on the undersurface of the left frontal lobe and no edema. The hippocampi appear normal in size and signal on dedicated temporal lobe imaging. Vascular: Major intracranial vascular flow voids are preserved. Skull and upper cervical spine: Unremarkable bone marrow signal. Sinuses/Orbits: Unremarkable orbits. Moderate mucosal thickening in the paranasal sinuses. Clear mastoid air cells. Other: None. IMPRESSION: 1. No acute intracranial abnormality. 2. 2 cm meningioma over the anterior left frontal convexity and 1 cm meningioma along the left anterior clinoid process. No significant mass effect or edema. 3. Mild chronic small vessel ischemic disease. Electronically Signed   By: Sebastian Ache M.D.   On: 02/24/2024 14:42     Signed, Lorin Glass, MD [facility] 02/24/2024

## 2024-02-24 NOTE — ED Triage Notes (Signed)
 Pt BIBGEMS from home after talking to daughter on phone felt dizzy, she fell but had a near syncopal episode. HR irregular with no hx. Orthostatic BP negative just HR increase. Cold for one week with decreased PO intake. Initial oxygen 90%  70/palp  CBG 134  20 RAC

## 2024-02-24 NOTE — ED Notes (Signed)
 CCMD called and notified

## 2024-02-24 NOTE — ED Provider Notes (Signed)
 [place] EMERGENCY DEPARTMENT AT [facility] Provider Note   CSN: [id number] Arrival date & time: 02/24/24  1019     History  Chief Complaint  Patient presents with   Near Syncope    Grace Nelson is a 73 y.o. female presents today for syncopal/seizure-like?  Episode.  Patient was seen for same 2 days ago and recommended that she have MRI, MRI was done at that time.  Patient was to follow-up with neurology today, but since having the second episode came to the ED instead.  After initial episode, patient was loaded with Keppra, patient did not pick up Keppra from pharmacy as she did not know it was sent there.  Patient does endorse loss of bladder during episode.  Patient denies tongue bite or recollection of event.   Near Syncope       Home Medications Prior to Admission medications   Medication Sig Start Date End Date Taking? Authorizing Provider  acetaminophen (TYLENOL) 650 MG CR tablet Take 650 mg by mouth every 8 (eight) hours as needed for pain.    [provider]  albuterol (VENTOLIN HFA) 108 (90 Base) MCG/ACT inhaler Inhale 2 puffs into the lungs every 6 (six) hours as needed for wheezing or shortness of breath. Use 3 times daily x 4 days then every 6 hours as needed. 08/29/22   Rodolph Bong, MD  cetirizine (ZYRTEC) 10 MG tablet Take 10 mg by mouth daily.    [provider]  fluticasone (FLONASE) 50 MCG/ACT nasal spray Place 2 sprays into both nostrils daily. 08/30/22   Rodolph Bong, MD  Fluticasone-Umeclidin-Vilant (TRELEGY ELLIPTA) 100-62.5-25 MCG/ACT AEPB Inhale 1 puff into the lungs daily. 10/03/23   Tomma Lightning, MD  levETIRAcetam (KEPPRA) 500 MG tablet Take 1 tablet (500 mg total) by mouth 2 (two) times daily. 02/22/24   Melene Plan, DO  nicotine polacrilex (NICORETTE) 2 MG gum Chew 1 piece (2 mg total) by mouth when needed as directed for smoking cessation 08/29/22   Rodolph Bong, MD  pantoprazole (PROTONIX) 40 MG  tablet Take 1 tablet (40 mg total) by mouth daily at 6 (six) AM. Patient not taking: Reported on 11/17/2022 08/30/22   Rodolph Bong, MD  simvastatin (ZOCOR) 20 MG tablet Take 20 mg by mouth at bedtime. 06/13/22   [provider]  VITAMIN D, CHOLECALCIFEROL, PO Take 1 tablet by mouth daily.    [provider]      Allergies    Nsaids and Sulfa antibiotics    Review of Systems   Review of Systems  Cardiovascular:  Positive for near-syncope.  Neurological:  Positive for seizures and syncope.    Physical Exam Updated Vital Signs BP 135/78   Pulse (!) 58   Temp 98.3 F (36.8 C) (Oral)   Resp 18   Ht 5\' 2"  (1.575 m)   Wt 78 kg   SpO2 90%   BMI 31.46 kg/m  Physical Exam Vitals and nursing note reviewed.  Constitutional:      General: She is not in acute distress.    Appearance: Normal appearance. She is well-developed. She is not ill-appearing, toxic-appearing or diaphoretic.  HENT:     Head: Normocephalic and atraumatic.     Right Ear: External ear normal.     Left Ear: External ear normal.     Nose: Nose normal.     Mouth/Throat:     Mouth: Mucous membranes are moist.  Eyes:     Extraocular  Movements: Extraocular movements intact.     Conjunctiva/sclera: Conjunctivae normal.     Pupils: Pupils are equal, round, and reactive to light.  Cardiovascular:     Rate and Rhythm: Normal rate and regular rhythm.     Pulses: Normal pulses.     Heart sounds: No murmur heard. Pulmonary:     Effort: Pulmonary effort is normal. No respiratory distress.     Breath sounds: Normal breath sounds.  Abdominal:     Palpations: Abdomen is soft.     Tenderness: There is no abdominal tenderness.  Musculoskeletal:        General: No swelling.     Cervical back: Normal range of motion and neck supple.  Skin:    General: Skin is warm and dry.     Capillary Refill: Capillary refill takes less than 2 seconds.  Neurological:     General: No focal deficit present.      Mental Status: She is alert and oriented to person, place, and time.     Cranial Nerves: No cranial nerve deficit.     Sensory: No sensory deficit.     Motor: No weakness.     Gait: Gait normal.  Psychiatric:        Mood and Affect: Mood normal.     ED Results / Procedures / Treatments   Labs (all labs ordered are listed, but only abnormal results are displayed) Labs Reviewed  CBC - Abnormal; Notable for the following components:      Result Value   WBC 13.1 (*)    All other components within normal limits  URINALYSIS, ROUTINE W REFLEX MICROSCOPIC - Abnormal; Notable for the following components:   APPearance CLOUDY (*)    Bilirubin Urine SMALL (*)    Leukocytes,Ua TRACE (*)    All other components within normal limits  URINALYSIS, MICROSCOPIC (REFLEX) - Abnormal; Notable for the following components:   Bacteria, UA RARE (*)    All other components within normal limits  BASIC METABOLIC PANEL  CBG MONITORING, ED    EKG None  Radiology MR Brain W and Wo Contrast Result Date: 02/24/2024 CLINICAL DATA:  Seizure, new-onset, no history of trauma. EXAM: MRI HEAD WITHOUT AND WITH CONTRAST TECHNIQUE: Multiplanar, multiecho pulse sequences of the brain and surrounding structures were obtained without and with intravenous contrast. CONTRAST:  7.63mL GADAVIST GADOBUTROL 1 MMOL/ML IV SOLN COMPARISON:  Head CT 02/22/2024 FINDINGS: Brain: There is no evidence of an acute infarct, intracranial hemorrhage, midline shift, or extra-axial fluid collection. Scattered small T2 hyperintensities in the cerebral white matter bilaterally are nonspecific but compatible with mild chronic small vessel ischemic disease. Cerebral volume is normal for age. The ventricles are normal in size. An enhancing, densely calcified extra-axial mass over the anterior left frontal convexity measures 2.0 x 1.4 cm with slight mass effect on the underlying frontal lobe and no edema. A smaller enhancing, calcified extra-axial  mass is located along the left anterior clinoid process and measures 1.0 x 0.6 cm with slight mass effect on the undersurface of the left frontal lobe and no edema. The hippocampi appear normal in size and signal on dedicated temporal lobe imaging. Vascular: Major intracranial vascular flow voids are preserved. Skull and upper cervical spine: Unremarkable bone marrow signal. Sinuses/Orbits: Unremarkable orbits. Moderate mucosal thickening in the paranasal sinuses. Clear mastoid air cells. Other: None. IMPRESSION: 1. No acute intracranial abnormality. 2. 2 cm meningioma over the anterior left frontal convexity and 1 cm meningioma along the left anterior clinoid  process. No significant mass effect or edema. 3. Mild chronic small vessel ischemic disease. Electronically Signed   By: Sebastian Ache M.D.   On: 02/24/2024 14:42    Procedures Procedures    Medications Ordered in ED Medications  gadobutrol (GADAVIST) 1 MMOL/ML injection 7.5 mL (7.5 mLs Intravenous Contrast Given 02/24/24 1259)    ED Course/ Medical Decision Making/ A&P                                 Medical Decision Making Amount and/or Complexity of Data Reviewed Labs: ordered. Radiology: ordered.  Risk Prescription drug management.   This patient presents to the ED with chief complaint(s) of near syncope/seizure with pertinent past medical history of COPD which further complicates the presenting complaint. The complaint involves an extensive differential diagnosis and also carries with it a high risk of complications and morbidity.    The differential diagnosis includes seizure, electrolyte abnormality, arrhythmia  Additional history obtained: Additional history obtained from spouse Records reviewed Care Everywhere/External Records  ED Course and Reassessment:   Independent labs interpretation:  The following labs were independently interpreted:  CBC: Leukocytosis at 13.1 BMP: No notable findings UA: Small bilirubin,  trace leukocytes, rare bacteria EEG: Pending CBG: 89 EKG: Sinus rhythm with a PAC  Independent visualization of imaging: - I independently visualized the following imaging with scope of interpretation limited to determining acute life threatening conditions related to emergency care: MRI brain with and without contrast, which revealed no acute intracranial abnormality, 2 cm meningioma over the anterior left frontal convexity and 1 cm meningioma along the left anterior clinoid process.  No significant mass effect or edema.  Consultation: - Consulted or discussed management/test interpretation w/ external professional: Hospitalist, Dr. Pola Corn he is agreeable to admission for EEG  Consideration for admission or further workup: Patient admitted         Final Clinical Impression(s) / ED Diagnoses Final diagnoses:  Seizure [facility])    Rx / DC Orders ED Discharge Orders     None         Gretta Began 02/24/24 1552    Benjiman Core, MD 02/25/24 [phone]

## 2024-02-24 NOTE — Progress Notes (Signed)
 EEG complete - results pending GMD R.EEG T. Did M&M and 1/2 head hook up. NLA took over for completion of EEG

## 2024-02-25 ENCOUNTER — Other Ambulatory Visit: Payer: Self-pay | Admitting: Physician Assistant

## 2024-02-25 ENCOUNTER — Observation Stay (HOSPITAL_BASED_OUTPATIENT_CLINIC_OR_DEPARTMENT_OTHER)

## 2024-02-25 DIAGNOSIS — R55 Syncope and collapse: Secondary | ICD-10-CM

## 2024-02-25 DIAGNOSIS — R569 Unspecified convulsions: Secondary | ICD-10-CM | POA: Diagnosis not present

## 2024-02-25 LAB — BASIC METABOLIC PANEL WITH GFR
Anion gap: 7 (ref 5–15)
BUN: 6 mg/dL — ABNORMAL LOW (ref 8–23)
CO2: 23 mmol/L (ref 22–32)
Calcium: 8.2 mg/dL — ABNORMAL LOW (ref 8.9–10.3)
Chloride: 112 mmol/L — ABNORMAL HIGH (ref 98–111)
Creatinine, Ser: 0.63 mg/dL (ref 0.44–1.00)
GFR, Estimated: >60 mL/min (ref >60)
Glucose, Bld: 95 mg/dL (ref 70–99)
Potassium: 3.3 mmol/L — ABNORMAL LOW (ref 3.5–5.1)
Sodium: 142 mmol/L (ref 135–145)

## 2024-02-25 LAB — CBC
HCT: 36.6 % (ref 36.0–46.0)
Hemoglobin: 12.1 g/dL (ref 12.0–15.0)
MCH: 29.4 pg (ref 26.0–34.0)
MCHC: 33.1 g/dL (ref 30.0–36.0)
MCV: 89.1 fL (ref 80.0–100.0)
Platelets: 212 10*3/uL (ref 150–400)
RBC: 4.11 MIL/uL (ref 3.87–5.11)
RDW: 12.8 % (ref 11.5–15.5)
WBC: 10.3 10*3/uL (ref 4.0–10.5)
nRBC: 0 % (ref 0.0–0.2)

## 2024-02-25 LAB — ECHOCARDIOGRAM COMPLETE
Area-P 1/2: 3.56 cm2
Calc EF: 62.5 %
Height: 62 in
S' Lateral: 2.5 cm
Single Plane A2C EF: 64.8 %
Single Plane A4C EF: 61.9 %
Weight: 2752 [oz_av]

## 2024-02-25 MED ORDER — POTASSIUM CHLORIDE CRYS ER 20 MEQ PO TBCR: 40.0000 meq | EXTENDED_RELEASE_TABLET | Freq: Once | ORAL | Status: DC

## 2024-02-25 NOTE — Plan of Care (Signed)

## 2024-02-25 NOTE — Discharge Summary (Signed)
 Physician Discharge Summary   Patient: Grace Nelson MRN: [id number] DOB: 05/22/1951  Admit date:     02/24/2024  Discharge date: 02/25/24  Discharge Physician: Briant Cedar   PCP: Sigmund Hazel, MD   Recommendations at discharge:   Follow-up with PCP in 1 week Follow-up with neurology as scheduled Follow-up with cardiology for heart monitor  Discharge Diagnoses: Principal Problem:   Seizure [facility])    Hospital Course: Grace Nelson is a 73 y.o. female with PMH significant for COPD, osteopenia, depression Patient was brought to the ED by EMS from home for dizziness, syncopal episodes, with possible seizure activity. On 3/24, patient had a syncopal episode at home while seated.  This was witnessed by her husband..  She had twitching of the corners of her mouth, did not have generalized seizure activity, but had an episode of bowel incontinence. She remained unresponsive for about 10 to 15 minutes gradually woke up. Seen at [facility] ED, work up with CT head showed an incidental finding of a meningioma. Pt was started on IV keppra in the ED, but left due long wait time and was not aware of prescription for Keppra that was sent. On3/26, patient had another syncopal episode at home.  She was talking to her daughter on the phone, felt dizzy and passed out.  No report of convulsions. EMS noted blood pressure low in 70s, brought her to ED. In the ED, patient was afebrile, heart rate mostly in 50s, blood pressure in normal range, breathing on room air Labs with WBC elevated to 13.1, otherwise unremarkable CBC, BMP. MRI brain only showed 2 cm meningioma over the anterior left frontal convexity and 1 cm meningioma along the left anterior clinoid process without significant mass effect or edema.  Patient admitted for further management.     Today, patient denies any new complaints, denies any further syncopal episodes or dizziness, denies any chest pain, abdominal pain,  nausea/vomiting, fever/chills.  Noted to be orthostatic positive.  Very eager to be discharged.  Advised to follow-up with neurology as well as cardiology and PCP.  Assessment and Plan:  Syncope Possible seizure Noted orthostatic positive History of bradycardia Brought in for 2 episodes of syncope followed by seizure-like activity No prior history of seizure or cardiac issues Reports poor oral intake MRI brain showed meningioma, not causing any mass effect EEG showed no seizures or epileptiform discharges Echo with EF of 60 to 65%, no regional wall motion abnormality, grade 1 diastolic dysfunction Neurology consulted, recommended continuing Keppra 500 mg twice daily, advised patient extensively not to drive for 6 months Cardiology informed to arrange heart monitor and follow-up with patient S/p IV fluids, advised/encouraged to stay hydrated Discharge patient with Keppra 500 mg twice daily Follow-up with neurology   COPD Respiratory status stable   HLD Statin  Obesity Lifestyle modification advised       Consultants: Neurology Procedures performed: None Disposition: Home Diet recommendation:  Cardiac diet    DISCHARGE MEDICATION: Allergies as of 02/25/2024       Reactions   Nsaids    Sulfa Antibiotics Hives   Nicotine Rash   Patches        Medication List     TAKE these medications    acetaminophen 650 MG CR tablet Commonly known as: TYLENOL Take 1,300 mg by mouth in the morning.   ACID REDUCER PO Take 1 tablet by mouth at bedtime.   cetirizine 10 MG tablet Commonly known as: ZYRTEC Take 10 mg by mouth  at bedtime.   levETIRAcetam 500 MG tablet Commonly known as: Keppra Take 1 tablet (500 mg total) by mouth 2 (two) times daily.   rosuvastatin 20 MG tablet Commonly known as: CRESTOR Take 20 mg by mouth daily.   Trelegy Ellipta 100-62.5-25 MCG/ACT Aepb Generic drug: Fluticasone-Umeclidin-Vilant Inhale 1 puff into the lungs daily. What  changed: when to take this   Vitamin D-3 125 MCG (5000 UT) Tabs Take 1 tablet by mouth at bedtime.   Wegovy 2.4 MG/0.75ML Soaj Generic drug: Semaglutide-Weight Management Inject 2.4 mg into the skin once a week. On Thursday        Follow-up Information     Pricilla Riffle, MD Follow up.   Specialty: Cardiology Why: cardiology scheduler will arrange a 2 week heart monitor and follow up with a cardiologist to review heart monitor Contact information: [address] [place] [address] [phone]         Sigmund Hazel, MD. Schedule an appointment as soon as possible for a visit in 1 week(s).   Specialty: Family Medicine Contact information: [address] [place] [address] [phone]         [facility] Follow up.   Specialty: Neurology Why: follow up Contact information: [address] [place] [address] [phone]               Discharge Exam: Ceasar Mons Weights   02/24/24 1026  Weight: 78 kg   General: NAD  Cardiovascular: S1, S2 present Respiratory: CTAB Abdomen: Soft, nontender, nondistended, bowel sounds present Musculoskeletal: No bilateral pedal edema noted Skin: Normal Psychiatry: Normal mood   Condition at discharge: stable  The results of significant diagnostics from this hospitalization (including imaging, microbiology, ancillary and laboratory) are listed below for reference.   Imaging Studies: ECHOCARDIOGRAM COMPLETE Result Date: 02/25/2024    ECHOCARDIOGRAM REPORT   Patient Name:   Grace Nelson Date of Exam: 02/25/2024 Medical Rec #:  [id number]            Height:       62.0 in Accession #:    [id number]           Weight:       172.0 lb Date of Birth:  1951/01/12            BSA:          1.793 m Patient Age:    72 years             BP:           138/73 mmHg Patient Gender: F                    HR:           70 bpm. Exam Location:  Inpatient Procedure:  2D Echo, Cardiac Doppler and Color Doppler (Both Spectral and Color            Flow Doppler were utilized during procedure). Indications:    R55 Syncope  History:        Patient has prior history of Echocardiogram examinations, most                 recent 08/28/2022. COPD; Risk Factors:Dyslipidemia and Current                 Smoker.  Sonographer:    Sheralyn Boatman RDCS Referring Phys: [id number] [facility]  Sonographer Comments: Technically difficult study due to  poor echo windows. IMPRESSIONS  1. Left ventricular ejection fraction, by estimation, is 60 to 65%. The left ventricle has normal function. The left ventricle has no regional wall motion abnormalities. Left ventricular diastolic parameters are consistent with Grade I diastolic dysfunction (impaired relaxation).  2. Right ventricular systolic function is normal. The right ventricular size is normal.  3. The mitral valve is degenerative. Mild mitral valve regurgitation. No evidence of mitral stenosis.  4. The aortic valve was not well visualized. Aortic valve regurgitation is not visualized. No aortic stenosis is present.  5. The inferior vena cava is normal in size with greater than 50% respiratory variability, suggesting right atrial pressure of 3 mmHg. FINDINGS  Left Ventricle: Left ventricular ejection fraction, by estimation, is 60 to 65%. The left ventricle has normal function. The left ventricle has no regional wall motion abnormalities. The left ventricular internal cavity size was normal in size. There is  no left ventricular hypertrophy. Left ventricular diastolic parameters are consistent with Grade I diastolic dysfunction (impaired relaxation). Right Ventricle: The right ventricular size is normal. No increase in right ventricular wall thickness. Right ventricular systolic function is normal. Left Atrium: Left atrial size was normal in size. Right Atrium: Right atrial size was normal in size. Pericardium: There is no evidence of pericardial effusion.  Mitral Valve: The mitral valve is degenerative in appearance. Mild mitral valve regurgitation. No evidence of mitral valve stenosis. Tricuspid Valve: The tricuspid valve is normal in structure. Tricuspid valve regurgitation is not demonstrated. No evidence of tricuspid stenosis. Aortic Valve: The aortic valve was not well visualized. Aortic valve regurgitation is not visualized. No aortic stenosis is present. Pulmonic Valve: The pulmonic valve was not well visualized. Pulmonic valve regurgitation is not visualized. No evidence of pulmonic stenosis. Aorta: The aortic root and ascending aorta are structurally normal, with no evidence of dilitation. Venous: The inferior vena cava is normal in size with greater than 50% respiratory variability, suggesting right atrial pressure of 3 mmHg. IAS/Shunts: No atrial level shunt detected by color flow Doppler.  LEFT VENTRICLE PLAX 2D LVIDd:         3.30 cm     Diastology LVIDs:         2.50 cm     LV e' medial:    10.20 cm/s LV PW:         1.50 cm     LV E/e' medial:  5.9 LV IVS:        1.40 cm     LV e' lateral:   13.40 cm/s LVOT diam:     1.90 cm     LV E/e' lateral: 4.5 LV SV:         67 LV SV Index:   37 LVOT Area:     2.84 cm  LV Volumes (MOD) LV vol d, MOD A2C: 45.4 ml LV vol d, MOD A4C: 43.6 ml LV vol s, MOD A2C: 16.0 ml LV vol s, MOD A4C: 16.6 ml LV SV MOD A2C:     29.4 ml LV SV MOD A4C:     43.6 ml LV SV MOD BP:      28.7 ml RIGHT VENTRICLE             IVC RV S prime:     10.40 cm/s  IVC diam: 1.90 cm TAPSE (M-mode): 2.1 cm LEFT ATRIUM             Index       RIGHT ATRIUM  Index LA diam:        2.60 cm 1.45 cm/m  RA Area:     6.64 cm LA Vol (A2C):   17.8 ml 9.93 ml/m  RA Volume:   7.99 ml  4.46 ml/m LA Vol (A4C):   15.5 ml 8.64 ml/m LA Biplane Vol: 16.5 ml 9.20 ml/m  AORTIC VALVE LVOT Vmax:   115.00 cm/s LVOT Vmean:  77.600 cm/s LVOT VTI:    0.237 m  AORTA Ao Root diam: 2.80 cm Ao Asc diam:  2.90 cm MITRAL VALVE MV Area (PHT): 3.56 cm    SHUNTS MV Decel  Time: 213 msec    Systemic VTI:  0.24 m MV E velocity: 60.37 cm/s  Systemic Diam: 1.90 cm MV A velocity: 80.70 cm/s MV E/A ratio:  0.75 Donato Schultz MD Electronically signed by Donato Schultz MD Signature Date/Time: 02/25/2024/3:28:29 PM    Final    EEG adult Result Date: 02/24/2024 Charlsie Quest, MD     02/24/2024  8:13 PM Patient Name: AURIANA SCALIA MRN: [id number] Epilepsy Attending: Charlsie Quest Referring Physician/Provider: Dolphus Jenny, PA-C Date:  02/24/2024 Duration: 23.07 mins Patient history: 73yo F with syncope. EEG to evaluate for seizure Level of alertness: Awake AEDs during EEG study: None Technical aspects: This EEG study was done with scalp electrodes positioned according to the 10-20 International system of electrode placement. Electrical activity was reviewed with band pass filter of 1-70Hz , sensitivity of 7 uV/mm, display speed of 24mm/sec with a 60Hz  notched filter applied as appropriate. EEG data were recorded continuously and digitally stored.  Video monitoring was available and reviewed as appropriate. Description: The posterior dominant rhythm consists of 9-10 Hz activity of moderate voltage (25-35 uV) seen predominantly in posterior head regions, symmetric and reactive to eye opening and eye closing. Physiologic photic driving was not seen during photic stimulation.  Hyperventilation was not performed.   IMPRESSION: This study is within normal limits. No seizures or epileptiform discharges were seen throughout the recording. A normal interictal EEG does not exclude the diagnosis of epilepsy. Charlsie Quest   MR Brain W and Wo Contrast Result Date: 02/24/2024 CLINICAL DATA:  Seizure, new-onset, no history of trauma. EXAM: MRI HEAD WITHOUT AND WITH CONTRAST TECHNIQUE: Multiplanar, multiecho pulse sequences of the brain and surrounding structures were obtained without and with intravenous contrast. CONTRAST:  7.30mL GADAVIST GADOBUTROL 1 MMOL/ML IV SOLN COMPARISON:  Head CT  02/22/2024 FINDINGS: Brain: There is no evidence of an acute infarct, intracranial hemorrhage, midline shift, or extra-axial fluid collection. Scattered small T2 hyperintensities in the cerebral white matter bilaterally are nonspecific but compatible with mild chronic small vessel ischemic disease. Cerebral volume is normal for age. The ventricles are normal in size. An enhancing, densely calcified extra-axial mass over the anterior left frontal convexity measures 2.0 x 1.4 cm with slight mass effect on the underlying frontal lobe and no edema. A smaller enhancing, calcified extra-axial mass is located along the left anterior clinoid process and measures 1.0 x 0.6 cm with slight mass effect on the undersurface of the left frontal lobe and no edema. The hippocampi appear normal in size and signal on dedicated temporal lobe imaging. Vascular: Major intracranial vascular flow voids are preserved. Skull and upper cervical spine: Unremarkable bone marrow signal. Sinuses/Orbits: Unremarkable orbits. Moderate mucosal thickening in the paranasal sinuses. Clear mastoid air cells. Other: None. IMPRESSION: 1. No acute intracranial abnormality. 2. 2 cm meningioma over the anterior left frontal convexity and 1 cm meningioma along  the left anterior clinoid process. No significant mass effect or edema. 3. Mild chronic small vessel ischemic disease. Electronically Signed   By: Sebastian Ache M.D.   On: 02/24/2024 14:42   DG Chest Port 1 View Result Date: 02/22/2024 CLINICAL DATA:  Near syncopal episode. Upper respiratory infection symptoms for 4 days. EXAM: PORTABLE CHEST 1 VIEW COMPARISON:  Radiographs 08/28/2022 and 08/25/2022.  CT 03/27/2023. FINDINGS: 1042 hours. Two views submitted. The heart size and mediastinal contours are stable with aortic atherosclerosis. Stable chronic central airway and interstitial thickening at both lung bases. No edema, confluent airspace disease, pneumothorax or significant pleural effusion. The  bones appear unremarkable. Telemetry leads overlie the chest. IMPRESSION: Stable chest.  No evidence of acute cardiopulmonary process. Electronically Signed   By: Carey Bullocks M.D.   On: 02/22/2024 11:56   CT Head Wo Contrast Result Date: 02/22/2024 CLINICAL DATA:  Syncope/presyncope, cerebrovascular cause suspected. EXAM: CT HEAD WITHOUT CONTRAST TECHNIQUE: Contiguous axial images were obtained from the base of the skull through the vertex without intravenous contrast. RADIATION DOSE REDUCTION: This exam was performed according to the departmental dose-optimization program which includes automated exposure control, adjustment of the mA and/or kV according to patient size and/or use of iterative reconstruction technique. COMPARISON:  None Available. FINDINGS: Brain: The brain shows a normal appearance without evidence of malformation, atrophy, old or acute small or large vessel infarction, intra-axial mass lesion, hemorrhage, hydrocephalus or extra-axial collection. Left frontal convexity 15 x 17 mm calcified meningioma without significant mass-effect upon the brain. Likely incidental. Vascular: There is atherosclerotic calcification of the major vessels at the base of the brain. Skull: Normal.  No traumatic finding.  No focal bone lesion. Sinuses/Orbits: Sinuses are clear. Orbits appear normal. Mastoids are clear. Other: None significant IMPRESSION: 1. No acute CT finding. 2. Left frontal convexity 15 x 17 mm calcified meningioma without significant mass-effect upon the brain. Likely incidental. 3. Atherosclerotic calcification of the major vessels at the base of the brain. Electronically Signed   By: Paulina Fusi M.D.   On: 02/22/2024 11:19    Microbiology: Results for orders placed or performed during the hospital encounter of 02/22/24  Resp panel by RT-PCR (RSV, Flu A&B, Covid) Anterior Nasal Swab     Status: None   Collection Time: 02/22/24 10:29 AM   Specimen: Anterior Nasal Swab  Result Value  Ref Range Status   SARS Coronavirus 2 by RT PCR NEGATIVE NEGATIVE Final    Comment: (NOTE) SARS-CoV-2 target nucleic acids are NOT DETECTED.  The SARS-CoV-2 RNA is generally detectable in upper respiratory specimens during the acute phase of infection. The lowest concentration of SARS-CoV-2 viral copies this assay can detect is 138 copies/mL. A negative result does not preclude SARS-Cov-2 infection and should not be used as the sole basis for treatment or other patient management decisions. A negative result may occur with  improper specimen collection/handling, submission of specimen other than nasopharyngeal swab, presence of viral mutation(s) within the areas targeted by this assay, and inadequate number of viral copies(<138 copies/mL). A negative result must be combined with clinical observations, patient history, and epidemiological information. The expected result is Negative.  Fact Sheet for Patients:  BloggerCourse.com  Fact Sheet for Healthcare Providers:  SeriousBroker.it  This test is no t yet approved or cleared by the [place] FDA and  has been authorized for detection and/or diagnosis of SARS-CoV-2 by FDA under an Emergency Use Authorization (EUA). This EUA will remain  in effect (meaning this test can  be used) for the duration of the COVID-19 declaration under Section 564(b)(1) of the Act, 21 U.S.C.section 360bbb-3(b)(1), unless the authorization is terminated  or revoked sooner.       Influenza A by PCR NEGATIVE NEGATIVE Final   Influenza B by PCR NEGATIVE NEGATIVE Final    Comment: (NOTE) The Xpert Xpress SARS-CoV-2/FLU/RSV plus assay is intended as an aid in the diagnosis of influenza from Nasopharyngeal swab specimens and should not be used as a sole basis for treatment. Nasal washings and aspirates are unacceptable for Xpert Xpress SARS-CoV-2/FLU/RSV testing.  Fact Sheet for  Patients: BloggerCourse.com  Fact Sheet for Healthcare Providers: SeriousBroker.it  This test is not yet approved or cleared by the [place] FDA and has been authorized for detection and/or diagnosis of SARS-CoV-2 by FDA under an Emergency Use Authorization (EUA). This EUA will remain in effect (meaning this test can be used) for the duration of the COVID-19 declaration under Section 564(b)(1) of the Act, 21 U.S.C. section 360bbb-3(b)(1), unless the authorization is terminated or revoked.     Resp Syncytial Virus by PCR NEGATIVE NEGATIVE Final    Comment: (NOTE) Fact Sheet for Patients: BloggerCourse.com  Fact Sheet for Healthcare Providers: SeriousBroker.it  This test is not yet approved or cleared by the [place] FDA and has been authorized for detection and/or diagnosis of SARS-CoV-2 by FDA under an Emergency Use Authorization (EUA). This EUA will remain in effect (meaning this test can be used) for the duration of the COVID-19 declaration under Section 564(b)(1) of the Act, 21 U.S.C. section 360bbb-3(b)(1), unless the authorization is terminated or revoked.  Performed at [facility], [address] [place], [place] [address]     Labs: CBC: Recent Labs  Lab 02/22/24 1029 02/24/24 1152 02/25/24 0417  WBC 11.7* 13.1* 10.3  NEUTROABS 9.4*  --   --   HGB 13.6 13.0 12.1  HCT 41.7 40.1 36.6  MCV 89.5 89.7 89.1  PLT 210 209 212   Basic Metabolic Panel: Recent Labs  Lab 02/22/24 1029 02/24/24 1152 02/25/24 0417  NA 140 140 142  K 4.1 3.7 3.3*  CL 108 107 112*  CO2 24 25 23   GLUCOSE 88 98 95  BUN 14 10 6*  CREATININE 0.72 0.75 0.63  CALCIUM 8.2* 8.9 8.2*   Liver Function Tests: Recent Labs  Lab 02/22/24 1029  AST 20  ALT 19  ALKPHOS 42  BILITOT 0.5  PROT 5.7*  ALBUMIN 3.7   CBG: Recent Labs  Lab 02/22/24 1025  02/24/24 1041  GLUCAP 84 89    Discharge time spent: less than 30 minutes.  Signed: Briant Cedar, MD [facility] 02/25/2024

## 2024-02-25 NOTE — TOC Transition Note (Signed)
 Transition of Care [facility]) - Discharge Note   Patient Details  Name: Grace Nelson MRN: [id number] Date of Birth: [birth year]  [facility]) CM/SW Contact:  Tom-Johnson, Hershal Coria, RN Phone Number: 02/25/2024, 4:09 PM   Clinical Narrative:     Patient is scheduled for discharge today.  Hospital f/u and discharge instructions on AVS. No TOC needs or recommendations noted. Husband, Lyman Bishop to transport at discharge.  No further TOC needs noted.         Final next level of care: Home/Self Care Barriers to Discharge: Barriers Resolved   Patient Goals and CMS Choice Patient states their goals for this hospitalization and ongoing recovery are:: To return home CMS Medicare.gov Compare Post Acute Care list provided to:: Patient Choice offered to / list presented to : NA      Discharge Placement                Patient to be transferred to facility by: Husband Name of family member notified: [facility] and Services Additional resources added to the After Visit Summary for                  DME Arranged: N/A DME Agency: NA       HH Arranged: NA HH Agency: NA        Social Drivers of Health (SDOH) Interventions SDOH Screenings   Food Insecurity: No Food Insecurity (02/24/2024)  Housing: Low Risk  (02/24/2024)  Transportation Needs: No Transportation Needs (02/24/2024)  Utilities: Not At Risk (02/24/2024)  Social Connections: Moderately Integrated (02/24/2024)  Tobacco Use: Medium Risk (02/24/2024)     Readmission Risk Interventions     No data to display

## 2024-02-25 NOTE — Consult Note (Signed)
 NEUROLOGY CONSULT NOTE   Date of service: February 25, 2024 Patient Name: Grace Nelson MRN:  [id number] DOB:  12/18/1950 Chief Complaint: "Loss of consciousness" Requesting Provider: Briant Cedar, MD  History of Present Illness  Grace Nelson is a 73 y.o. female with hx of two episodes of loss of consciousness.  She describes the first episode which happened on Monday and is occurring in the seated position.  She was on a stool, when she felt her vision go blurry and she called out for her husband for assistance.  He arrived, and held her in a seated position on the stool.  He then noticed her eyes "rolled back in her head" and she did lose consciousness for a period of time.  She does not remember being particularly lightheaded with this episode.  He thinks that she was then a state of confusion for about 10 minutes and then popped out of it.  She did lose consciousness with this episode, but did not have any abnormal appendicular movements.  He does remember her "mouth quivering."  The second episode happened yesterday.  With this episode, she was also sitting down, but she felt severely lightheaded and she called out for her husband who assisted her in getting to the floor.  When she was supine, her symptoms improved.  Her heart rate was in the 50s and her blood pressure was in the 70s with EMS on the second episode, there was no hypotension noted on the first episode.  She states that the two episodes felt fairly discrete and like different things.  Brain imaging reveals a meningioma in the left frontal region, otherwise relatively unremarkable.  Past History   Past Medical History:  Diagnosis Date   COPD (chronic obstructive pulmonary disease) (HCC)    Depression    Osteopenia     Past Surgical History:  Procedure Laterality Date   ABDOMINAL HYSTERECTOMY     APPENDECTOMY      Family History: Family History  Problem Relation Age of Onset   Ulcers Mother         pancreatic bleeding ulcer   Hypertension Mother    Cancer Mother        colon   COPD Father    Cancer Father        lung   Heart disease Father        before age 49   Heart attack Father    Kidney disease Brother     Social History  reports that she quit smoking about 18 months ago. Her smoking use included cigarettes. She started smoking about 46 years ago. She has never used smokeless tobacco. She reports that she does not drink alcohol and does not use drugs.  Allergies  Allergen Reactions   Nsaids    Sulfa Antibiotics Hives   Nicotine Rash    Patches    Medications   Current Facility-Administered Medications:    0.9 %  sodium chloride infusion, , Intravenous, Continuous, Dahal, Binaya, MD, Last Rate: 100 mL/hr at 02/25/24 0347, New Bag at 02/25/24 0347   acetaminophen (TYLENOL) tablet 650 mg, 650 mg, Oral, Q6H PRN **OR** acetaminophen (TYLENOL) suppository 650 mg, 650 mg, Rectal, Q6H PRN, Dahal, Binaya, MD   albuterol (PROVENTIL) (2.5 MG/3ML) 0.083% nebulizer solution 2.5 mg, 2.5 mg, Nebulization, Q6H PRN, Dahal, Binaya, MD   bisacodyl (DULCOLAX) EC tablet 5 mg, 5 mg, Oral, Daily PRN, Dahal, Binaya, MD   enoxaparin (LOVENOX) injection 40 mg, 40 mg, Subcutaneous, Q24H,  Lorin Glass, MD   hydrALAZINE (APRESOLINE) injection 10 mg, 10 mg, Intravenous, Q6H PRN, Dahal, Binaya, MD   levETIRAcetam (KEPPRA) tablet 500 mg, 500 mg, Oral, BID, Dahal, Binaya, MD, 500 mg at 02/25/24 0924   polyethylene glycol (MIRALAX / GLYCOLAX) packet 17 g, 17 g, Oral, Daily PRN, Lorin Glass, MD  Vitals   Vitals:   02/24/24 1639 02/24/24 1854 02/25/24 0222 02/25/24 0544  BP: 133/70 (!) 150/99 (!) 145/90 138/73  Pulse: 84 (!) 58 77 75  Resp: 20 18 18 18   Temp: 98.3 F (36.8 C) 98.3 F (36.8 C) 98.2 F (36.8 C) 98.2 F (36.8 C)  TempSrc: Oral Oral Oral Oral  SpO2: 94% 97% 94% 93%  Weight:      Height:        Body mass index is 31.46 kg/m.  Physical Exam   Constitutional: Appears  well-developed and well-nourished.   Neurologic Examination    Neuro: Mental Status: Patient is awake, alert, she is interactive and appropriate Patient is able to give a clear and coherent history. No signs of aphasia or neglect Cranial Nerves: II: Visual Fields are full. Pupils are equal, round, and reactive to light.   III,IV, VI: EOMI without ptosis or diploplia.  V: Facial sensation is symmetric to temperature VII: Facial movement is symmetric.  VIII: hearing is intact to voice X: Uvula elevates symmetrically XII: tongue is midline without atrophy or fasciculations.  Motor: Tone is normal. Bulk is normal. 5/5 strength was present in all four extremities.  Sensory: Sensation is symmetric to light touch and temperature in the arms and legs.Cerebellar: FNF intact bilaterally        Labs/Imaging/Neurodiagnostic studies   CBC:  Recent Labs  Lab 03-21-24 1029 02/24/24 1152 02/25/24 0417  WBC 11.7* 13.1* 10.3  NEUTROABS 9.4*  --   --   HGB 13.6 13.0 12.1  HCT 41.7 40.1 36.6  MCV 89.5 89.7 89.1  PLT 210 209 212   Basic Metabolic Panel:  Lab Results  Component Value Date   NA 142 02/25/2024   K 3.3 (L) 02/25/2024   CO2 23 02/25/2024   GLUCOSE 95 02/25/2024   BUN 6 (L) 02/25/2024   CREATININE 0.63 02/25/2024   CALCIUM 8.2 (L) 02/25/2024   GFRNONAA >60 02/25/2024    MRI Brain(Personally reviewed): Two small meningiomas.  No edema or mass effect, though the left anterior one does abut the brain   ASSESSMENT   Grace Nelson is a 73 y.o. female with two episodes of concern in the past week.  The second sounds very much like syncope and was associated with hypotension and bradycardia.  The first episode, however, the patient describes as a very discrete episode from the first one.  From description, it is possible that this was a seizure, given the duration.  The fact that she was held in a seated position, however, does make using the duration less  helpful.  If the first episode was definitely a seizure, in the setting of her meningiomas I would favor starting antiepileptic therapy.  I described to her that I did not think there was going to be a clear way to delineate whether that episode was a seizure or not, and therefore we had two options moving forward:  First, given that I am not certain that she needs lifelong antiepileptic therapy and she was not going to be driving for a period of 6 months, taking a "watch and wait" approach.  This would entail not starting antiepileptic  therapy unless she were to have further events which time a decision can be made.  Second, given the possibility that she has had a seizure in the setting of a structural lesion (meningioma) I think it would be reasonable to start an antiepileptic at this time as I do have high enough index of suspicion.  After discussing these two options with the patient and her husband, the patient indicates that she would favor going on antiepileptic therapy.  Alternatively, with her pulse being low on Wednesday, I think prolonged cardiac monitoring with a Holter monitor or Zio patch would be prudent.  If she were found to have another clear etiology such as symptomatic bradycardia, then I think we could stop antiepileptics at that time.  RECOMMENDATIONS  Keppra 500 mg twice daily I have informed the patient that she is not allowed to drive for 6 months, that this is applicable whether the patient had a seizure or not given that she lost consciousness in the seated position for an unclear etiology, and that this is a legal requirement.  Echo, would favor cardiac monitoring. Neurology will be available as needed, please call with further questions or concerns. Follow-up with outpatient neurology. ______________________________________________________________________    Stormy Card, MD [facility]

## 2024-02-25 NOTE — Progress Notes (Signed)
  Echocardiogram 2D Echocardiogram has been performed.  Janalyn Harder 02/25/2024, 2:56 PM

## 2024-02-25 NOTE — Progress Notes (Signed)
 Hospitalist service requested a 2 week ZIO monitor. DOD Dr. Tenny Craw to read.

## 2024-02-25 NOTE — Care Management Obs Status (Signed)
 MEDICARE OBSERVATION STATUS NOTIFICATION   Patient Details  Name: Grace Nelson MRN: [id number] Date of Birth: 1951/01/14   Medicare Observation Status Notification Given:  Yes    Lorri Frederick, LCSW 02/25/2024, 4:05 PM

## 2024-02-25 NOTE — Progress Notes (Signed)
 DISCHARGE NOTE HOME [name] to be discharged Home per MD order. Discussed prescriptions and follow up appointments with the patient. Prescriptions given to patient; medication list explained in detail. Patient verbalized understanding.  Skin clean, dry and intact without evidence of skin break down, no evidence of skin tears noted. IV catheter discontinued intact. Site without signs and symptoms of complications. Dressing and pressure applied. Pt denies pain at the site currently. No complaints noted.  Patient free of lines, drains, and wounds.   An After Visit Summary (AVS) was printed and given to the patient. Patient escorted via wheelchair, and discharged home via private auto.  Velia Meyer, RN

## 2024-02-25 NOTE — Evaluation (Signed)
 Physical Therapy Evaluation Patient Details Name: Grace Nelson MRN: [id number] DOB: [birth year] Today's Date: 02/25/2024  History of Present Illness  Pt is a 73 y.o. female presenting 02/24/24 s/p multiple syncope episodes followed by seizure-like activity. Admitted for seizures vs syncope. PMHx COPD, osteopenia, depression, HLD  Clinical Impression  Pt is a 73 y.o. female presenting on 02/24/24 with above conditions and deficits below, see PT Problem List. PTA pt lived independently with her husband in a one level home, with 2-4 STE, and drove. Currently the pt is mod I for bed mobility, independent in transfers, and supervision for safety and IV management during ambulation (pt is capable of independent ambulation). Given the pt's proximity to her functional mobility baseline, PLOF, available family support, current condition, and anticipated prognosis, PT recommends no acute PT or follow-PT. All education completed and questions answered. Pt would benefit from continued mobility with nurses and mobility specialists until d/c. Will sign off acutely.          If plan is discharge home, recommend the following: Assistance with cooking/housework;Help with stairs or ramp for entrance   Can travel by private vehicle        Equipment Recommendations None recommended by PT  Recommendations for Other Services       Functional Status Assessment Patient has not had a recent decline in their functional status     Precautions / Restrictions Precautions Precautions: Fall Recall of Precautions/Restrictions: Intact Precaution/Restrictions Comments: low risk for falls Restrictions Weight Bearing Restrictions Per Provider Order: No      Mobility  Bed Mobility Overal bed mobility: Modified Independent Bed Mobility: Supine to Sit     Supine to sit: Modified independent (Device/Increase time)     General bed mobility comments: HOB is elevated. Doesn't require increased time     Transfers Overall transfer level: Independent Equipment used: None Transfers: Sit to/from Stand Sit to Stand: Independent           General transfer comment: pt is capable of independent transfers, stands with slightly increase trunk flexion and wide BOS    Ambulation/Gait Ambulation/Gait assistance: Supervision Gait Distance (Feet): 150 Feet Assistive device: None Gait Pattern/deviations: Step-through pattern, Trunk flexed Gait velocity: normal Gait velocity interpretation: >4.37 ft/sec, indicative of normal walking speed   General Gait Details: Increased out-toeing of R foot with slight foot slap. Pt is capable of independent ambulation  Stairs            Wheelchair Mobility     Tilt Bed    Modified Rankin (Stroke Patients Only)       Balance Overall balance assessment: Independent                                           Pertinent Vitals/Pain Pain Assessment Pain Assessment: No/denies pain    Home Living Family/patient expects to be discharged to:: Private residence Living Arrangements: Spouse/significant other Available Help at Discharge: Family;Available 24 hours/day Type of Home: House Home Access: Stairs to enter Entrance Stairs-Rails: Left Entrance Stairs-Number of Steps: 2-4   Home Layout: One level Home Equipment: None      Prior Function Prior Level of Function : Independent/Modified Independent;Driving             Mobility Comments: independent ADLs Comments: independent     Extremity/Trunk Assessment   Upper Extremity Assessment Upper Extremity Assessment: Overall WFL for tasks assessed  Lower Extremity Assessment Lower Extremity Assessment: Overall WFL for tasks assessed    Cervical / Trunk Assessment Cervical / Trunk Assessment: Normal  Communication   Communication Communication: No apparent difficulties    Cognition Arousal: Alert Behavior During Therapy: WFL for tasks  assessed/performed   PT - Cognitive impairments: No apparent impairments                         Following commands: Intact       Cueing Cueing Techniques: Verbal cues     General Comments General comments (skin integrity, edema, etc.): Pt's BP: supine- 1142/62 (85) HR: 69; Standing: 160/125 (137) HR: 82    Exercises     Assessment/Plan    PT Assessment Patient does not need any further PT services  PT Problem List         PT Treatment Interventions      PT Goals (Current goals can be found in the Care Plan section)  Acute Rehab PT Goals Patient Stated Goal: return home PT Goal Formulation: All assessment and education complete, DC therapy Time For Goal Achievement: 03/10/24 Potential to Achieve Goals: Good    Frequency       Co-evaluation               AM-PAC PT "6 Clicks" Mobility  Outcome Measure Help needed turning from your back to your side while in a flat bed without using bedrails?: None Help needed moving from lying on your back to sitting on the side of a flat bed without using bedrails?: None Help needed moving to and from a bed to a chair (including a wheelchair)?: None Help needed standing up from a chair using your arms (e.g., wheelchair or bedside chair)?: None Help needed to walk in hospital room?: None Help needed climbing 3-5 steps with a railing? : A Little 6 Click Score: 23    End of Session Equipment Utilized During Treatment: Gait belt Activity Tolerance: Patient tolerated treatment well Patient left: in bed;with call bell/phone within reach;with family/visitor present (pt sitting EOB) Nurse Communication: Mobility status PT Visit Diagnosis: Other abnormalities of gait and mobility (R26.89);Other symptoms and signs involving the nervous system (R29.898)    Time: 4401-0272 PT Time Calculation (min) (ACUTE ONLY): 16 min   Charges:   PT Evaluation $PT Eval Low Complexity: 1 Low   PT General Charges $$ ACUTE PT VISIT:  1 Visit         [address], SPT   [place] 02/25/2024, 10:28 AM

## 2024-02-25 NOTE — Plan of Care (Signed)

## 2024-02-29 ENCOUNTER — Other Ambulatory Visit (INDEPENDENT_AMBULATORY_CARE_PROVIDER_SITE_OTHER)

## 2024-02-29 DIAGNOSIS — R569 Unspecified convulsions: Secondary | ICD-10-CM | POA: Diagnosis not present

## 2024-02-29 DIAGNOSIS — R55 Syncope and collapse: Secondary | ICD-10-CM

## 2024-02-29 DIAGNOSIS — I499 Cardiac arrhythmia, unspecified: Secondary | ICD-10-CM | POA: Diagnosis not present

## 2024-02-29 NOTE — Progress Notes (Unsigned)
Enrolled for [employer] to mail a ZIO AT Live Telemetry monitor to patients address on file.   Dr. Tenny Craw to read.

## 2024-03-09 DIAGNOSIS — R55 Syncope and collapse: Secondary | ICD-10-CM | POA: Diagnosis not present

## 2024-03-22 DIAGNOSIS — I700 Atherosclerosis of aorta: Secondary | ICD-10-CM | POA: Diagnosis not present

## 2024-03-22 DIAGNOSIS — Z8639 Personal history of other endocrine, nutritional and metabolic disease: Secondary | ICD-10-CM | POA: Diagnosis not present

## 2024-03-22 DIAGNOSIS — E663 Overweight: Secondary | ICD-10-CM | POA: Diagnosis not present

## 2024-03-22 DIAGNOSIS — Z6829 Body mass index (BMI) 29.0-29.9, adult: Secondary | ICD-10-CM | POA: Diagnosis not present

## 2024-03-28 ENCOUNTER — Encounter (HOSPITAL_BASED_OUTPATIENT_CLINIC_OR_DEPARTMENT_OTHER): Payer: Self-pay

## 2024-03-28 ENCOUNTER — Ambulatory Visit (HOSPITAL_BASED_OUTPATIENT_CLINIC_OR_DEPARTMENT_OTHER)
Admission: RE | Admit: 2024-03-28 | Discharge: 2024-03-28 | Disposition: A | Discharge status: Home / Self Care | Source: Ambulatory Visit | Attending: Acute Care | Admitting: Acute Care

## 2024-03-28 DIAGNOSIS — Z87891 Personal history of nicotine dependence: Secondary | ICD-10-CM | POA: Diagnosis not present

## 2024-03-28 DIAGNOSIS — F17210 Nicotine dependence, cigarettes, uncomplicated: Secondary | ICD-10-CM | POA: Insufficient documentation

## 2024-03-28 DIAGNOSIS — Z122 Encounter for screening for malignant neoplasm of respiratory organs: Secondary | ICD-10-CM | POA: Insufficient documentation

## 2024-03-29 NOTE — Progress Notes (Signed)
 Cardiology Office Note:  .   Date:  03/29/2024  ID:  Franchot Ion, DOB Jul 18, 1951, MRN [id number] PCP: Perley Bradley, MD  [facility] Providers Cardiologist:  Fransico Ivy, MD PCP: Perley Bradley, MD  Chief Complaint  Patient presents with   Syncope     Grace Nelson is a 73 y.o. female with loss of consciousness  Discussed the use of AI scribe software for clinical note transcription with the patient, who gave verbal consent to proceed.  History of Present Illness Grace Nelson "Grace Nelson" is a 73 year old female who presents with episodes of syncope and dizziness. She is accompanied by her husband. She was referred by Dr. Annabell Key for evaluation of her syncope episodes.  Cathy experienced two episodes of syncope in March. The first occurred on March 24th while sitting at a bar after having coffee, leading to loss of consciousness, eye rolling, and incontinence. She regained consciousness shortly after. A CT scan at the hospital revealed a meningioma, considered unrelated to the syncope. The second episode on March 26th involved severe dizziness and a fainting sensation without loss of consciousness. No further episodes have occurred since.  She has no prior episodes of syncope, chest pain, palpitations, or lightheadedness. Her medical history includes coronary and aorta calcifications. She is on rosuvastatin for cholesterol management and Keppra  for seizures, prescribed after the second syncope episode. She quit smoking two years ago. Her heart artery calcification has been stable over time.      Vitals:   03/31/24 1010  BP: 124/78  Pulse: 70  SpO2: 95%   Orthostatic VS for the past 72 hrs (Last 3 readings):  Orthostatic BP Patient Position BP Location Cuff Size Orthostatic Pulse  03/31/24 1033 128/83 Standing -- Normal 77  03/31/24 1032 115/82 Sitting -- Normal 83  03/31/24 1031 127/83 Supine -- Normal 65  03/31/24 1010 -- Sitting Left Arm -- --      Review of Systems  Cardiovascular:  Positive for syncope. Negative for chest pain, dyspnea on exertion, leg swelling and palpitations.        Studies Reviewed: Aaron Aas        Independently interpreted 01/2024: Hb 12.1 Cr 0.63, K 2.2 Trop HS 5, BNP 27, d-Dimer 0.27  Zio patch monitor Patch Wear Time:  13 days and 20 hours   Impression:  Sinus rhythm  Rates 51 to 197 bpm  Average HR 70 bpm Occasional PAC (1.9% total) Rare PVC 7 short bursts SVT, fastest for 10 beats at 197 bpm No diary entries or triggered events   Echocardiogram 01/2024: EF 60 to 65%.  Grade 1 diastolic dysfunction. Mild mitral regurgitation.  MRI brain 01/2024: 1. No acute intracranial abnormality. 2. 2 cm meningioma over the anterior left frontal convexity and 1 cm meningioma along the left anterior clinoid process. No significant mass effect or edema. 3. Mild chronic small vessel ischemic disease.  CT chest 03/2024: 1. Lung-RADS 2, benign appearance or behavior. Continue annual screening with low-dose chest CT without contrast in 12 months. 2. Coronary artery calcifications, aortic Atherosclerosis (ICD10-I70.0) and Emphysema (ICD10-J43.9).    Physical Exam Vitals and nursing note reviewed.  Constitutional:      General: She is not in acute distress. Neck:     Vascular: No JVD.  Cardiovascular:     Rate and Rhythm: Normal rate and regular rhythm.     Heart sounds: Normal heart sounds. No murmur heard. Pulmonary:     Effort: Pulmonary effort is normal.  Breath sounds: Normal breath sounds. No wheezing or rales.  Musculoskeletal:     Right lower leg: No edema.     Left lower leg: No edema.     VISIT DIAGNOSES:   ICD-10-CM   1. Syncope and collapse  R55     2. Loss of consciousness (HCC)  R40.20     3. Coronary artery calcification  I25.10        Grace Nelson is a 73 y.o. female with syncope  Assessment & Plan Syncope: Loss of consciousness without any warning sign.  Structurally normal heart. Only occasional ectopy and atrial tachcyardia on 2 week monitor.  While it remains speculation to know if she would have had any arrhythmia at the time of her syncope, I overall suspicion for arrhythmogenic syncope is quite low.  I discussed with her regarding placement of loop recorder for further monitoring, but she is not very keen at this time.  Lab workup during ER visit showed normal troponin, BNP, D-dimer.  She does not drink much water, generally less than 500 cc.  However, she is not orthostatic today.  Regardless, I have encouraged her to increase fluid intake.  There is coronary calcification noted on lung cancer screening chest scan for several years.  However, I do not think this is directly related to her syncope and absence of any exertional chest pain symptoms.  Head imaging showed incidental finding of meningioma, but it was felt not to be the cause of her syncope.  Patient asked me if she needs to continue taking Keppra .  I will defer this to her PCP Dr. Annabell Key, and neurologist Dr. Camaro that she is going to see in June.  As indicated previously by her neurologist, I agree that she should not drive for up to 6 months since her last episode of syncope/loss of consciousness.  Coronary artery calcification Chronic calcification on CT, no new angina or ischemia. Cholesterol controlled with rosuvastatin. Low suspicion of coronary artery disease causing syncope. Continue rosuvastatin.       F/u as needed  Signed, Cody Das, MD

## 2024-03-30 DIAGNOSIS — R55 Syncope and collapse: Secondary | ICD-10-CM | POA: Diagnosis not present

## 2024-03-31 ENCOUNTER — Ambulatory Visit: Attending: Cardiology | Admitting: Cardiology

## 2024-03-31 ENCOUNTER — Encounter: Payer: Self-pay | Admitting: Cardiology

## 2024-03-31 VITALS — BP 124/78 | HR 70 | Ht 63.0 in | Wt 171.6 lb

## 2024-03-31 DIAGNOSIS — I2510 Atherosclerotic heart disease of native coronary artery without angina pectoris: Secondary | ICD-10-CM | POA: Diagnosis not present

## 2024-03-31 DIAGNOSIS — R4020 Unspecified coma: Secondary | ICD-10-CM | POA: Insufficient documentation

## 2024-03-31 DIAGNOSIS — R55 Syncope and collapse: Secondary | ICD-10-CM | POA: Diagnosis not present

## 2024-03-31 NOTE — Patient Instructions (Signed)
 Follow-Up: At [facility], you and your health needs are our priority.  As part of our continuing mission to provide you with exceptional heart care, our providers are all part of one team.  This team includes your primary Cardiologist (physician) and Advanced Practice Providers or APPs (Physician Assistants and Nurse Practitioners) who all work together to provide you with the care you need, when you need it.  Your next appointment:   As needed  Provider:   Cody Das, MD

## 2024-04-01 ENCOUNTER — Encounter: Payer: Self-pay | Admitting: Cardiology

## 2024-04-06 NOTE — Progress Notes (Signed)
 Agree.  Patient was notified about the same at the time of the office visit.  Thanks MJP

## 2024-04-11 DIAGNOSIS — R55 Syncope and collapse: Secondary | ICD-10-CM | POA: Diagnosis not present

## 2024-04-11 DIAGNOSIS — E66811 Obesity, class 1: Secondary | ICD-10-CM | POA: Diagnosis not present

## 2024-04-11 DIAGNOSIS — J449 Chronic obstructive pulmonary disease, unspecified: Secondary | ICD-10-CM | POA: Diagnosis not present

## 2024-04-11 DIAGNOSIS — Z6830 Body mass index (BMI) 30.0-30.9, adult: Secondary | ICD-10-CM | POA: Diagnosis not present

## 2024-04-11 DIAGNOSIS — Z87891 Personal history of nicotine dependence: Secondary | ICD-10-CM | POA: Diagnosis not present

## 2024-04-11 DIAGNOSIS — I2510 Atherosclerotic heart disease of native coronary artery without angina pectoris: Secondary | ICD-10-CM | POA: Diagnosis not present

## 2024-04-21 ENCOUNTER — Other Ambulatory Visit: Payer: Self-pay

## 2024-04-21 DIAGNOSIS — F17210 Nicotine dependence, cigarettes, uncomplicated: Secondary | ICD-10-CM

## 2024-04-21 DIAGNOSIS — Z122 Encounter for screening for malignant neoplasm of respiratory organs: Secondary | ICD-10-CM

## 2024-04-21 DIAGNOSIS — Z87891 Personal history of nicotine dependence: Secondary | ICD-10-CM

## 2024-04-26 DIAGNOSIS — E663 Overweight: Secondary | ICD-10-CM | POA: Diagnosis not present

## 2024-04-26 DIAGNOSIS — Z6829 Body mass index (BMI) 29.0-29.9, adult: Secondary | ICD-10-CM | POA: Diagnosis not present

## 2024-04-26 DIAGNOSIS — Z8639 Personal history of other endocrine, nutritional and metabolic disease: Secondary | ICD-10-CM | POA: Diagnosis not present

## 2024-04-26 DIAGNOSIS — I700 Atherosclerosis of aorta: Secondary | ICD-10-CM | POA: Diagnosis not present

## 2024-05-12 ENCOUNTER — Ambulatory Visit: Admitting: Neurology

## 2024-05-12 ENCOUNTER — Encounter: Payer: Self-pay | Admitting: Neurology

## 2024-05-12 VITALS — BP 118/68 | Ht 63.0 in | Wt 166.0 lb

## 2024-05-12 DIAGNOSIS — D329 Benign neoplasm of meninges, unspecified: Secondary | ICD-10-CM | POA: Diagnosis not present

## 2024-05-12 DIAGNOSIS — R55 Syncope and collapse: Secondary | ICD-10-CM

## 2024-05-12 DIAGNOSIS — R569 Unspecified convulsions: Secondary | ICD-10-CM | POA: Diagnosis not present

## 2024-05-12 MED ORDER — LEVETIRACETAM 250 MG PO TABS: 250.0000 mg | ORAL_TABLET | Freq: Two times a day (BID) | ORAL | 1 refills | Status: DC

## 2024-05-12 NOTE — Patient Instructions (Addendum)
 Continue with Keppra  but 250 mg twice daily due to side            Continue your other medications  Increase fluid intake  Return in 6 months or sooner if worse  Please call if you do have another event, at that time, we will consider ambulatory EEG

## 2024-05-12 NOTE — Progress Notes (Signed)
 [facility]  PATIENT: Grace Nelson DOB: [birth year]  REQUESTING CLINICIAN: Albertus Hughs, DO HISTORY FROM: Patient/Husband/Chart review REASON FOR VISIT: Seizure vs. Syncope    HISTORICAL  CHIEF COMPLAINT:  Chief Complaint  Patient presents with   Seizures    Rm 13. Urgent Internal referral for new onset seizure. Pt reports most recent seizure was end of march. Pt unsure it was a seizure.     HISTORY OF PRESENT ILLNESS:  This is a 73 year old woman past medical history of COPD, depression, hyperlipidemia who is presenting after 2 events concerning for seizure.  The first one occurred on March 24 and the second one on March 26.  Both episodes were witnessed by her husband. She tells me the first episode she was sitting down, she felt like she was going to pass out, called her husband and when husband arrived, patient was started to lose consciousness with eyes rolled back.  He was able to assist her to the floor.  There was no reported stiffness, or abnormal movements but patient eyes were rolled back.  She did also have urinary incontinence.  EMS was called and patient taken to the hospital. The second episode happened 2 days later, same feeling, was at home, felt like she was going to pass out and called her husband.  Again she was helped to the floor but this time patient tells me that she did not lose consciousness.  She was able to hear her husband.  EMS was called, and she was told that her blood pressure was very low in the 70s.  She was again taken to the hospital she did have an EEG which was normal, MRI brain which showed 2 small left hemispheric meningioma without any mass effect.  She was started on Keppra  500 mg twice daily, started the medication experienced somnolence and sleepiness, discontinued for about 3 months but her PCP restarted it. Currently she is taking 500 mg twice daily but again complains of sleepiness and drowsiness.  She does report  previous history history of syncope, but no seizure or no seizure risk factors.  Handedness: Right handed   Onset: 02/22/2024  Seizure Type: Loss of consciousness   Current frequency: Only twice   Any injuries from seizures: Denies   Seizure risk factors: None reported. MRI showed 2 small meningiomas   Previous ASMs: None   Currenty ASMs: Levetiracetam  500 mg twice daily   ASMs side effects: Somnolence, Sleepiness   Brain Images: 2 mall meningioma without mass effect   Previous EEGs: normal    OTHER MEDICAL CONDITIONS: Hyperlipidemia, COPD   REVIEW OF SYSTEMS: Full 14 system review of systems performed and negative with exception of: As noted in the HPI   ALLERGIES: Allergies  Allergen Reactions   Nsaids    Sulfa Antibiotics Hives   Nicotine  Rash    Patches   HOME MEDICATIONS: Outpatient Medications Prior to Visit  Medication Sig Dispense Refill   acetaminophen  (TYLENOL ) 650 MG CR tablet Take 1,300 mg by mouth in the morning.     cetirizine (ZYRTEC) 10 MG tablet Take 10 mg by mouth at bedtime.     Cholecalciferol (VITAMIN D-3) 125 MCG (5000 UT) TABS Take 1 tablet by mouth at bedtime.     Famotidine (ACID REDUCER PO) Take 1 tablet by mouth at bedtime.     Fluticasone -Umeclidin-Vilant (TRELEGY ELLIPTA ) 100-62.5-25 MCG/ACT AEPB Inhale 1 puff into the lungs daily. (Patient taking differently: Inhale 1 puff into the lungs in the morning.) 180  each 3   rosuvastatin (CRESTOR) 20 MG tablet Take 20 mg by mouth daily.     WEGOVY 2.4 MG/0.75ML SOAJ Inject 2.4 mg into the skin once a week. On Thursday     levETIRAcetam  (KEPPRA ) 500 MG tablet Take 1 tablet (500 mg total) by mouth 2 (two) times daily. 60 tablet 0   No facility-administered medications prior to visit.    PAST MEDICAL HISTORY: Past Medical History:  Diagnosis Date   COPD (chronic obstructive pulmonary disease) (HCC)    Depression    Osteopenia     PAST SURGICAL HISTORY: Past Surgical History:  Procedure  Laterality Date   ABDOMINAL HYSTERECTOMY     APPENDECTOMY      FAMILY HISTORY: Family History  Problem Relation Age of Onset   Ulcers Mother        pancreatic bleeding ulcer   Hypertension Mother    Cancer Mother        colon   COPD Father    Cancer Father        lung   Heart disease Father        before age 10   Heart attack Father    Kidney disease Brother     SOCIAL HISTORY: Social History   Socioeconomic History   Marital status: Married    Spouse name: Not on file   Number of children: Not on file   Years of education: Not on file   Highest education level: Not on file  Occupational History   Not on file  Tobacco Use   Smoking status: Former    Current packs/day: 0.00    Types: Cigarettes    Start date: 08/25/1977    Quit date: 08/25/2022    Years since quitting: 1.7   Smokeless tobacco: Never   Tobacco comments:    Pt quit smoking 08/25/22 Harding Li 09/17/22      Vaping Use   Vaping status: Never Used  Substance and Sexual Activity   Alcohol use: No    Alcohol/week: 0.0 standard drinks of alcohol   Drug use: No   Sexual activity: Not Currently  Other Topics Concern   Not on file  Social History Narrative   Not on file   Social Drivers of Health   Financial Resource Strain: Not on file  Food Insecurity: No Food Insecurity (02/24/2024)   Hunger Vital Sign    Worried About Running Out of Food in the Last Year: Never true    Ran Out of Food in the Last Year: Never true  Transportation Needs: No Transportation Needs (02/24/2024)   PRAPARE - Administrator, Civil Service (Medical): No    Lack of Transportation (Non-Medical): No  Physical Activity: Not on file  Stress: Not on file  Social Connections: Moderately Integrated (02/24/2024)   Social Connection and Isolation Panel    Frequency of Communication with Friends and Family: More than three times a week    Frequency of Social Gatherings with Friends and Family: Twice a week     Attends Religious Services: Never    Database administrator or Organizations: Yes    Attends Banker Meetings: Never    Marital Status: Married  Catering manager Violence: Not At Risk (02/24/2024)   Humiliation, Afraid, Rape, and Kick questionnaire    Fear of Current or Ex-Partner: No    Emotionally Abused: No    Physically Abused: No    Sexually Abused: No    PHYSICAL EXAM  GENERAL EXAM/CONSTITUTIONAL: Vitals:  Vitals:   05/12/24 1258  BP: 118/68  SpO2: 95%  Weight: 166 lb (75.3 kg)  Height: 5' 3 (1.6 m)   Body mass index is 29.41 kg/m. Wt Readings from Last 3 Encounters:  05/12/24 166 lb (75.3 kg)  03/31/24 171 lb 9.6 oz (77.8 kg)  02/24/24 172 lb (78 kg)   Patient is in no distress; well developed, nourished and groomed; neck is supple  MUSCULOSKELETAL: Gait, strength, tone, movements noted in Neurologic exam below  NEUROLOGIC: MENTAL STATUS:      No data to display         awake, alert, oriented to person, place and time recent and remote memory intact normal attention and concentration language fluent, comprehension intact, naming intact fund of knowledge appropriate  CRANIAL NERVE:  2nd, 3rd, 4th, 6th - Visual fields full to confrontation, extraocular muscles intact, no nystagmus 5th - facial sensation symmetric 7th - facial strength symmetric 8th - hearing intact 9th - palate elevates symmetrically, uvula midline 11th - shoulder shrug symmetric 12th - tongue protrusion midline  MOTOR:  normal bulk and tone, full strength in the BUE, BLE  SENSORY:  normal and symmetric to light touch  COORDINATION:  finger-nose-finger, fine finger movements normal  GAIT/STATION:  normal   DIAGNOSTIC DATA (LABS, IMAGING, TESTING) - I reviewed patient records, labs, notes, testing and imaging myself where available.  Lab Results  Component Value Date   WBC 10.3 02/25/2024   HGB 12.1 02/25/2024   HCT 36.6 02/25/2024   MCV 89.1  02/25/2024   PLT 212 02/25/2024      Component Value Date/Time   NA 142 02/25/2024 0417   K 3.3 (L) 02/25/2024 0417   CL 112 (H) 02/25/2024 0417   CO2 23 02/25/2024 0417   GLUCOSE 95 02/25/2024 0417   BUN 6 (L) 02/25/2024 0417   CREATININE 0.63 02/25/2024 0417   CALCIUM 8.2 (L) 02/25/2024 0417   PROT 5.7 (L) 02/22/2024 1029   ALBUMIN 3.7 02/22/2024 1029   AST 20 02/22/2024 1029   ALT 19 02/22/2024 1029   ALKPHOS 42 02/22/2024 1029   BILITOT 0.5 02/22/2024 1029   GFRNONAA >60 02/25/2024 0417   No results found for: CHOL, HDL, LDLCALC, LDLDIRECT, TRIG No results found for: HGBA1C No results found for: VITAMINB12 Lab Results  Component Value Date   TSH 0.521 08/26/2022    MRI Brain 02/24/2024 1. No acute intracranial abnormality. 2. 2 cm meningioma over the anterior left frontal convexity and 1 cm meningioma along the left anterior clinoid process. No significant mass effect or edema. 3. Mild chronic small vessel ischemic disease   rEEG 02/24/2024: Normal    ASSESSMENT AND PLAN  73 y.o. year old female  with history of depression, hyperlipidemia, COPD who is presenting after 2 episodes concerning for seizure.  Prior to both episodes, she felt like she was going to pass out.  There was no reported tongue biting, abnormal movement, stiffness of the body but she did have urinary incontinence.  During the second episode, she was told that her blood pressure was in the 70s systolic.  I do suspect patient had syncopal episode due to dehydration but cannot rule out seizures.  Currently she is on levetiracetam  500 mg twice daily but does have side effect of somnolence, sleepiness, will reduce the dose to 250 mg twice daily.  I will see again in 6 months, at that time I will repeat the EEG and if normal, and patient interested in  coming off medication we can discontinue the Levetiracetam  and continue to observe patient advised if she does have another event to contact me at  that time we will consider getting an ambulatory EEG.  She was understanding.  Return sooner if worse. For her meningioma, they are small, no mass effect or edema, most likely not contributory.    1. Seizure-like activity (HCC)   2. Syncope, unspecified syncope type   3. Meningioma [facility])     Patient Instructions  Continue with Keppra  but 250 mg twice daily due to side            Continue your other medications  Increase fluid intake  Return in 6 months or sooner if worse  Please call if you do have another event, at that time, we will consider ambulatory EEG                 Per [place]  DM[place] statutes, patients with seizures are not allowed to drive until they have been seizure-free for six months.  Other recommendations include using caution when using heavy equipment or power tools. Avoid working on ladders or at heights. Take showers instead of baths.  Do not swim alone.  Ensure the water temperature is not too high on the home water heater. Do not go swimming alone. Do not lock yourself in a room alone (i.e. bathroom). When caring for infants or small children, sit down when holding, feeding, or changing them to minimize risk of injury to the child in the event you have a seizure. Maintain good sleep hygiene. Avoid alcohol.  Also recommend adequate sleep, hydration, good diet and minimize stress.   During the Seizure  - First, ensure adequate ventilation and place patients on the floor on their left side  Loosen clothing around the neck and ensure the airway is patent. If the patient is clenching the teeth, do not force the mouth open with any object as this can cause severe damage - Remove all items from the surrounding that can be hazardous. The patient may be oblivious to what's happening and may not even know what he or she is doing. If the patient is confused and wandering, either gently guide him/her away and block access to outside areas - Reassure the individual and be  comforting - Call 911. In most cases, the seizure ends before EMS arrives. However, there are cases when seizures may last over 3 to 5 minutes. Or the individual may have developed breathing difficulties or severe injuries. If a pregnant patient or a person with diabetes develops a seizure, it is prudent to call an ambulance. - Finally, if the patient does not regain full consciousness, then call EMS. Most patients will remain confused for about 45 to 90 minutes after a seizure, so you must use judgment in calling for help. - Avoid restraints but make sure the patient is in a bed with padded side rails - Place the individual in a lateral position with the neck slightly flexed; this will help the saliva drain from the mouth and prevent the tongue from falling backward - Remove all nearby furniture and other hazards from the area - Provide verbal assurance as the individual is regaining consciousness - Provide the patient with privacy if possible - Call for help and start treatment as ordered by the caregiver   After the Seizure (Postictal Stage)  After a seizure, most patients experience confusion, fatigue, muscle pain and/or a headache. Thus, one should permit the individual to sleep.  For the next few days, reassurance is essential. Being calm and helping reorient the person is also of importance.  Most seizures are painless and end spontaneously. Seizures are not harmful to others but can lead to complications such as stress on the lungs, brain and the heart. Individuals with prior lung problems may develop labored breathing and respiratory distress.    Discussed Patients with epilepsy have a small risk of sudden unexpected death, a condition referred to as sudden unexpected death in epilepsy (SUDEP). SUDEP is defined specifically as the sudden, unexpected, witnessed or unwitnessed, nontraumatic and nondrowning death in patients with epilepsy with or without evidence for a seizure, and excluding  documented status epilepticus, in which post mortem examination does not reveal a structural or toxicologic cause for death     No orders of the defined types were placed in this encounter.   Meds ordered this encounter  Medications   levETIRAcetam  (KEPPRA ) 250 MG tablet    Sig: Take 1 tablet (250 mg total) by mouth 2 (two) times daily.    Dispense:  180 tablet    Refill:  1    Return in about 6 months (around 11/11/2024).    Cassandra Cleveland, MD 05/12/2024, 1:46 PM  [facility] [address] [place], [place] [address] [phone]

## 2024-05-24 DIAGNOSIS — Z8639 Personal history of other endocrine, nutritional and metabolic disease: Secondary | ICD-10-CM | POA: Diagnosis not present

## 2024-05-24 DIAGNOSIS — I700 Atherosclerosis of aorta: Secondary | ICD-10-CM | POA: Diagnosis not present

## 2024-05-24 DIAGNOSIS — E663 Overweight: Secondary | ICD-10-CM | POA: Diagnosis not present

## 2024-05-24 DIAGNOSIS — Z6829 Body mass index (BMI) 29.0-29.9, adult: Secondary | ICD-10-CM | POA: Diagnosis not present

## 2024-05-27 ENCOUNTER — Ambulatory Visit: Admitting: Cardiology

## 2024-06-21 DIAGNOSIS — Z6829 Body mass index (BMI) 29.0-29.9, adult: Secondary | ICD-10-CM | POA: Diagnosis not present

## 2024-06-21 DIAGNOSIS — Z8639 Personal history of other endocrine, nutritional and metabolic disease: Secondary | ICD-10-CM | POA: Diagnosis not present

## 2024-06-21 DIAGNOSIS — I700 Atherosclerosis of aorta: Secondary | ICD-10-CM | POA: Diagnosis not present

## 2024-06-21 DIAGNOSIS — E663 Overweight: Secondary | ICD-10-CM | POA: Diagnosis not present

## 2024-07-19 DIAGNOSIS — E663 Overweight: Secondary | ICD-10-CM | POA: Diagnosis not present

## 2024-07-19 DIAGNOSIS — Z8639 Personal history of other endocrine, nutritional and metabolic disease: Secondary | ICD-10-CM | POA: Diagnosis not present

## 2024-07-19 DIAGNOSIS — I700 Atherosclerosis of aorta: Secondary | ICD-10-CM | POA: Diagnosis not present

## 2024-07-19 DIAGNOSIS — Z6829 Body mass index (BMI) 29.0-29.9, adult: Secondary | ICD-10-CM | POA: Diagnosis not present

## 2024-08-18 DIAGNOSIS — E663 Overweight: Secondary | ICD-10-CM | POA: Diagnosis not present

## 2024-08-18 DIAGNOSIS — Z8639 Personal history of other endocrine, nutritional and metabolic disease: Secondary | ICD-10-CM | POA: Diagnosis not present

## 2024-08-18 DIAGNOSIS — I700 Atherosclerosis of aorta: Secondary | ICD-10-CM | POA: Diagnosis not present

## 2024-08-18 DIAGNOSIS — Z6829 Body mass index (BMI) 29.0-29.9, adult: Secondary | ICD-10-CM | POA: Diagnosis not present

## 2024-08-29 ENCOUNTER — Other Ambulatory Visit: Payer: Self-pay | Admitting: Pulmonary Disease

## 2024-09-14 DIAGNOSIS — Z1231 Encounter for screening mammogram for malignant neoplasm of breast: Secondary | ICD-10-CM | POA: Diagnosis not present

## 2024-09-14 DIAGNOSIS — M85852 Other specified disorders of bone density and structure, left thigh: Secondary | ICD-10-CM | POA: Diagnosis not present

## 2024-09-14 DIAGNOSIS — M85851 Other specified disorders of bone density and structure, right thigh: Secondary | ICD-10-CM | POA: Diagnosis not present

## 2024-09-29 DIAGNOSIS — I700 Atherosclerosis of aorta: Secondary | ICD-10-CM | POA: Diagnosis not present

## 2024-09-29 DIAGNOSIS — Z6827 Body mass index (BMI) 27.0-27.9, adult: Secondary | ICD-10-CM | POA: Diagnosis not present

## 2024-09-29 DIAGNOSIS — Z8639 Personal history of other endocrine, nutritional and metabolic disease: Secondary | ICD-10-CM | POA: Diagnosis not present

## 2024-09-29 DIAGNOSIS — E663 Overweight: Secondary | ICD-10-CM | POA: Diagnosis not present

## 2024-10-03 ENCOUNTER — Telehealth: Payer: Self-pay

## 2024-10-03 MED ORDER — TRELEGY ELLIPTA 100-62.5-25 MCG/ACT IN AEPB: 1.0000 | INHALATION_SPRAY | Freq: Every day | RESPIRATORY_TRACT | 0 refills | Status: DC

## 2024-10-03 NOTE — Telephone Encounter (Signed)
 Copied from CRM #[id number]. Topic: Clinical - Medication Question >> Oct 03, 2024  8:18 AM Grace Nelson wrote: Reason for CRM: Patient calling to request reasoning Trelegy refill was denied. Informed patient that patient needs appointment. Patient declined first available offered of 10/07/2024 with provider. Scheduled for 12/14/2024. Patient placed on waitlist.   Patient requesting call to advise about medication until appointment arrives.   Sending refills to last pt until appt. Pt is aware nfn

## 2024-10-10 ENCOUNTER — Ambulatory Visit: Admitting: Pulmonary Disease

## 2024-10-10 ENCOUNTER — Encounter: Payer: Self-pay | Admitting: Pulmonary Disease

## 2024-10-10 VITALS — BP 124/84 | HR 62 | Temp 98.1°F | Ht 63.0 in | Wt 160.2 lb

## 2024-10-10 DIAGNOSIS — J431 Panlobular emphysema: Secondary | ICD-10-CM | POA: Diagnosis not present

## 2024-10-10 MED ORDER — TRELEGY ELLIPTA 100-62.5-25 MCG/ACT IN AEPB
1.0000 | INHALATION_SPRAY | Freq: Every day | RESPIRATORY_TRACT | 4 refills | Status: AC
Start: 2024-10-10 — End: ?

## 2024-10-10 NOTE — Patient Instructions (Signed)
 Prescription for Trelegy sent in for you  Make sure you are staying active  Call us  with significant concerns  I will see you a year from now

## 2024-10-10 NOTE — Progress Notes (Signed)
 Synopsis: Referred for abnormal CT, COPD by Cleotilde Planas, MD  Subjective:   PATIENT ID: Grace Nelson GENDER: female DOB: [birth year], MRN: [id number]  In for follow-up  History of chronic obstructive pulmonary disease Tolerating Trelegy well  Staying active  Not having any significant problems today  Reviewed previous PFT showing obstructive disease  Reviewed CT scan showing evidence of emphysema  She tries to stay active, she is not limiting her salt respect to daily activities   Past Medical History:  Diagnosis Date   COPD (chronic obstructive pulmonary disease) (HCC)    Depression    Osteopenia      Family History  Problem Relation Age of Onset   Ulcers Mother        pancreatic bleeding ulcer   Hypertension Mother    Cancer Mother        colon   COPD Father    Cancer Father        lung   Heart disease Father        before age 38   Heart attack Father    Kidney disease Brother      Past Surgical History:  Procedure Laterality Date   ABDOMINAL HYSTERECTOMY     APPENDECTOMY      Social History   Socioeconomic History   Marital status: Married    Spouse name: Not on file   Number of children: Not on file   Years of education: Not on file   Highest education level: Not on file  Occupational History   Not on file  Tobacco Use   Smoking status: Former    Current packs/day: 0.00    Types: Cigarettes    Start date: 08/25/1977    Quit date: 08/25/2022    Years since quitting: 2.1   Smokeless tobacco: Never   Tobacco comments:    Pt quit smoking 08/25/22 Archie JONELLE Che 09/17/22      Vaping Use   Vaping status: Never Used  Substance and Sexual Activity   Alcohol use: No    Alcohol/week: 0.0 standard drinks of alcohol   Drug use: No   Sexual activity: Not Currently  Other Topics Concern   Not on file  Social History Narrative   Not on file   Social Drivers of Health   Financial Resource Strain: Not on file  Food Insecurity: No  Food Insecurity (02/24/2024)   Hunger Vital Sign    Worried About Running Out of Food in the Last Year: Never true    Ran Out of Food in the Last Year: Never true  Transportation Needs: No Transportation Needs (02/24/2024)   PRAPARE - Administrator, Civil Service (Medical): No    Lack of Transportation (Non-Medical): No  Physical Activity: Not on file  Stress: Not on file  Social Connections: Moderately Integrated (02/24/2024)   Social Connection and Isolation Panel    Frequency of Communication with Friends and Family: More than three times a week    Frequency of Social Gatherings with Friends and Family: Twice a week    Attends Religious Services: Never    Database Administrator or Organizations: Yes    Attends Banker Meetings: Never    Marital Status: Married  Catering Manager Violence: Not At Risk (02/24/2024)   Humiliation, Afraid, Rape, and Kick questionnaire    Fear of Current or Ex-Partner: No    Emotionally Abused: No    Physically Abused: No    Sexually Abused: No  Allergies  Allergen Reactions   Nsaids    Other Other (See Comments)   Sulfa Antibiotics Hives   Nicotine  Rash    Patches     Outpatient Medications Prior to Visit  Medication Sig Dispense Refill   acetaminophen  (TYLENOL ) 650 MG CR tablet Take 1,300 mg by mouth in the morning.     cetirizine (ZYRTEC) 10 MG tablet Take 10 mg by mouth at bedtime.     Cholecalciferol (VITAMIN D-3) 125 MCG (5000 UT) TABS Take 1 tablet by mouth at bedtime.     Famotidine (ACID REDUCER PO) Take 1 tablet by mouth at bedtime.     levETIRAcetam  (KEPPRA ) 250 MG tablet Take 1 tablet (250 mg total) by mouth 2 (two) times daily. 180 tablet 1   Magnesium 300 MG CAPS 1 capsule with a meal Orally Once a day     rosuvastatin (CRESTOR) 20 MG tablet Take 20 mg by mouth daily.     WEGOVY 2.4 MG/0.75ML SOAJ Inject 2.4 mg into the skin once a week. On Thursday     Fluticasone -Umeclidin-Vilant (TRELEGY ELLIPTA )  100-62.5-25 MCG/ACT AEPB Inhale 1 puff into the lungs daily. 60 each 0   No facility-administered medications prior to visit.     Objective:   Physical Exam:  Elderly, does not appear to be in distress Moist oral mucosa Clear breath sounds S1-S2 appreciated Bowel sounds appreciated Extremities shows no clubbing no edema  BMI Readings from Last 3 Encounters:  10/10/24 28.38 kg/m  05/12/24 29.41 kg/m  03/31/24 30.40 kg/m   Wt Readings from Last 3 Encounters:  10/10/24 160 lb 3.2 oz (72.7 kg)  05/12/24 166 lb (75.3 kg)  03/31/24 171 lb 9.6 oz (77.8 kg)     CBC    Component Value Date/Time   WBC 10.3 02/25/2024 0417   RBC 4.11 02/25/2024 0417   HGB 12.1 02/25/2024 0417   HCT 36.6 02/25/2024 0417   PLT 212 02/25/2024 0417   MCV 89.1 02/25/2024 0417   MCH 29.4 02/25/2024 0417   MCHC 33.1 02/25/2024 0417   RDW 12.8 02/25/2024 0417   LYMPHSABS 1.4 02/22/2024 1029   MONOABS 0.8 02/22/2024 1029   EOSABS 0.0 02/22/2024 1029   BASOSABS 0.0 02/22/2024 1029     Chest Imaging: Last chest x-ray was in 2023 with flattened hemidiaphragms  CT chest with emphysema, bronchial thickening  Pulmonary Functions Testing Results:    Latest Ref Rng & Units 11/17/2022    9:52 AM 08/19/2017    9:39 AM  PFT Results  FVC-Pre L 1.92  2.07   FVC-Predicted Pre % 66  67   FVC-Post L 1.94  2.07   FVC-Predicted Post % 67  67   Pre FEV1/FVC % % 62  60   Post FEV1/FCV % % 62  59   FEV1-Pre L 1.19  1.25   FEV1-Predicted Pre % 54  53   FEV1-Post L 1.21  1.23   DLCO uncorrected ml/min/mmHg 9.00  8.51   DLCO UNC% % 46  35   DLCO corrected ml/min/mmHg 9.00  8.20   DLCO COR %Predicted % 46  33   DLVA Predicted % 51  42   TLC L 5.90  5.37   TLC % Predicted % 116  106   RV % Predicted % 153  146      Echocardiogram: Ejection fraction of 60 to 65%, right ventricular function is normal     Assessment & Plan:   Class II COPD  Reformed smoker  Abnormal CT showing  emphysema   Plan  Continue Trelegy 100  Talked about de-escalating from Trelegy if she continues to have hoarseness  Encouraged her to watch a few short videos to reinforce how to use the inhaler well  Follow-up a year from now  Encouraged to call with significant concerns  Prescription for 3 months placed with refills

## 2024-10-17 DIAGNOSIS — E663 Overweight: Secondary | ICD-10-CM | POA: Diagnosis not present

## 2024-10-17 DIAGNOSIS — Z6828 Body mass index (BMI) 28.0-28.9, adult: Secondary | ICD-10-CM | POA: Diagnosis not present

## 2024-10-17 DIAGNOSIS — J449 Chronic obstructive pulmonary disease, unspecified: Secondary | ICD-10-CM | POA: Diagnosis not present

## 2024-10-17 DIAGNOSIS — Z0000 Encounter for general adult medical examination without abnormal findings: Secondary | ICD-10-CM | POA: Diagnosis not present

## 2024-10-17 DIAGNOSIS — I2510 Atherosclerotic heart disease of native coronary artery without angina pectoris: Secondary | ICD-10-CM | POA: Diagnosis not present

## 2024-10-17 DIAGNOSIS — Z23 Encounter for immunization: Secondary | ICD-10-CM | POA: Diagnosis not present

## 2024-10-17 DIAGNOSIS — M8580 Other specified disorders of bone density and structure, unspecified site: Secondary | ICD-10-CM | POA: Diagnosis not present

## 2024-10-17 DIAGNOSIS — Z1331 Encounter for screening for depression: Secondary | ICD-10-CM | POA: Diagnosis not present

## 2024-10-18 DIAGNOSIS — Z8639 Personal history of other endocrine, nutritional and metabolic disease: Secondary | ICD-10-CM | POA: Diagnosis not present

## 2024-10-18 DIAGNOSIS — E663 Overweight: Secondary | ICD-10-CM | POA: Diagnosis not present

## 2024-10-18 DIAGNOSIS — Z6827 Body mass index (BMI) 27.0-27.9, adult: Secondary | ICD-10-CM | POA: Diagnosis not present

## 2024-11-12 ENCOUNTER — Other Ambulatory Visit: Payer: Self-pay | Admitting: Neurology

## 2024-12-14 ENCOUNTER — Ambulatory Visit: Admitting: Pulmonary Disease

## 2025-01-05 ENCOUNTER — Encounter: Payer: Self-pay | Admitting: Neurology

## 2025-01-05 ENCOUNTER — Ambulatory Visit: Admitting: Neurology

## 2025-01-05 VITALS — BP 126/76 | HR 84 | Ht 63.0 in | Wt 159.5 lb

## 2025-01-05 DIAGNOSIS — R569 Unspecified convulsions: Secondary | ICD-10-CM

## 2025-01-05 DIAGNOSIS — R55 Syncope and collapse: Secondary | ICD-10-CM

## 2025-01-05 NOTE — Progress Notes (Signed)
 "  [facility]  PATIENT: Grace Nelson DOB: 1951-11-30  REQUESTING CLINICIAN: Cleotilde Planas, MD HISTORY FROM: Patient/Husband/Chart review REASON FOR VISIT: Seizure vs. Syncope    HISTORICAL  CHIEF COMPLAINT:  Chief Complaint  Patient presents with   Follow-up    Room 12 With Husband No sz with the last 6 months    INTERVAL HISTORY 01/05/2025 Patient presents today for follow-up, last visit was in June.  Since then she has been doing well, denies any seizure or seizure like activity.  She is compliant with the levetiracetam  250 mg twice daily, denies any side effects.  She tells me she has not had any additional episode of syncope, no seizure-like episode and no episode of hypotension.  Overall she is doing very well. She again reiterates the fact that she does not believe that she had a seizure, felt like she was dehydrated which caused her to pass out.   HISTORY OF PRESENT ILLNESS:  This is a 74 year old woman past medical history of COPD, depression, hyperlipidemia who is presenting after 2 events concerning for seizure.  The first one occurred on March 24 and the second one on March 26.  Both episodes were witnessed by her husband. She tells me the first episode she was sitting down, she felt like she was going to pass out, called her husband and when husband arrived, patient was started to lose consciousness with eyes rolled back.  He was able to assist her to the floor.  There was no reported stiffness, or abnormal movements but patient eyes were rolled back.  She did also have urinary incontinence.  EMS was called and patient taken to the hospital. The second episode happened 2 days later, same feeling, was at home, felt like she was going to pass out and called her husband.  Again she was helped to the floor but this time patient tells me that she did not lose consciousness.  She was able to hear her husband.  EMS was called, and she was told that her blood  pressure was very low in the 70s.  She was again taken to the hospital she did have an EEG which was normal, MRI brain which showed 2 small left hemispheric meningioma without any mass effect.  She was started on Keppra  500 mg twice daily, started the medication experienced somnolence and sleepiness, discontinued for about 3 months but her PCP restarted it. Currently she is taking 500 mg twice daily but again complains of sleepiness and drowsiness.  She does report previous history history of syncope, but no seizure or no seizure risk factors.  Handedness: Right handed   Onset: 02/22/2024  Seizure Type: Loss of consciousness   Current frequency: Only twice   Any injuries from seizures: Denies   Seizure risk factors: None reported. MRI showed 2 small meningiomas   Previous ASMs: None   Currenty ASMs: Levetiracetam  250 mg twice daily   ASMs side effects: None   Brain Images: 2 mall meningioma without mass effect   Previous EEGs: normal    OTHER MEDICAL CONDITIONS: Hyperlipidemia, COPD   REVIEW OF SYSTEMS: Full 14 system review of systems performed and negative with exception of: As noted in the HPI   ALLERGIES: Allergies  Allergen Reactions   Nsaids    Other Other (See Comments)   Sulfa Antibiotics Hives   Nicotine  Rash    Patches   HOME MEDICATIONS: Outpatient Medications Prior to Visit  Medication Sig Dispense Refill   acetaminophen  (TYLENOL ) 650  MG CR tablet Take 1,300 mg by mouth in the morning.     alendronate (FOSAMAX) 70 MG tablet Take 70 mg by mouth once a week.     cetirizine (ZYRTEC) 10 MG tablet Take 10 mg by mouth at bedtime.     Cholecalciferol (VITAMIN D-3) 125 MCG (5000 UT) TABS Take 1 tablet by mouth at bedtime.     Famotidine (ACID REDUCER PO) Take 1 tablet by mouth at bedtime.     Fluticasone -Umeclidin-Vilant (TRELEGY ELLIPTA ) 100-62.5-25 MCG/ACT AEPB Inhale 1 puff into the lungs daily. 180 each 4   levETIRAcetam  (KEPPRA ) 250 MG tablet TAKE 1 TABLET BY  MOUTH 2 TIMES A DAY 180 tablet 1   Magnesium 300 MG CAPS 1 capsule with a meal Orally Once a day     rosuvastatin (CRESTOR) 20 MG tablet Take 20 mg by mouth daily.     WEGOVY 2.4 MG/0.75ML SOAJ Inject 2.4 mg into the skin once a week. On Thursday     No facility-administered medications prior to visit.    PAST MEDICAL HISTORY: Past Medical History:  Diagnosis Date   COPD (chronic obstructive pulmonary disease) (HCC)    Depression    Osteopenia     PAST SURGICAL HISTORY: Past Surgical History:  Procedure Laterality Date   ABDOMINAL HYSTERECTOMY     APPENDECTOMY      FAMILY HISTORY: Family History  Problem Relation Age of Onset   Ulcers Mother        pancreatic bleeding ulcer   Hypertension Mother    Cancer Mother        colon   COPD Father    Cancer Father        lung   Heart disease Father        before age 6   Heart attack Father    Kidney disease Brother     SOCIAL HISTORY: Social History   Socioeconomic History   Marital status: Married    Spouse name: Not on file   Number of children: Not on file   Years of education: Not on file   Highest education level: Not on file  Occupational History   Not on file  Tobacco Use   Smoking status: Former    Current packs/day: 0.00    Types: Cigarettes    Start date: 08/25/1977    Quit date: 08/25/2022    Years since quitting: 2.3   Smokeless tobacco: Never   Tobacco comments:    Pt quit smoking 08/25/22 Archie JONELLE Che 09/17/22      Vaping Use   Vaping status: Never Used  Substance and Sexual Activity   Alcohol use: No    Alcohol/week: 0.0 standard drinks of alcohol   Drug use: No   Sexual activity: Not Currently  Other Topics Concern   Not on file  Social History Narrative   Not on file   Social Drivers of Health   Tobacco Use: Medium Risk (01/05/2025)   Patient History    Smoking Tobacco Use: Former    Smokeless Tobacco Use: Never    Passive Exposure: Not on Actuary Strain:  Not on file  Food Insecurity: No Food Insecurity (02/24/2024)   Hunger Vital Sign    Worried About Running Out of Food in the Last Year: Never true    Ran Out of Food in the Last Year: Never true  Transportation Needs: No Transportation Needs (02/24/2024)   PRAPARE - Administrator, Civil Service (Medical): No  Lack of Transportation (Non-Medical): No  Physical Activity: Not on file  Stress: Not on file  Social Connections: Moderately Integrated (02/24/2024)   Social Connection and Isolation Panel    Frequency of Communication with Friends and Family: More than three times a week    Frequency of Social Gatherings with Friends and Family: Twice a week    Attends Religious Services: Never    Database Administrator or Organizations: Yes    Attends Banker Meetings: Never    Marital Status: Married  Catering Manager Violence: Not At Risk (02/24/2024)   Humiliation, Afraid, Rape, and Kick questionnaire    Fear of Current or Ex-Partner: No    Emotionally Abused: No    Physically Abused: No    Sexually Abused: No  Depression (PHQ2-9): Not on file  Alcohol Screen: Not on file  Housing: Low Risk (02/24/2024)   Housing Stability Vital Sign    Unable to Pay for Housing in the Last Year: No    Number of Times Moved in the Last Year: 0    Homeless in the Last Year: No  Utilities: Not At Risk (02/24/2024)   AHC Utilities    Threatened with loss of utilities: No  Health Literacy: Not on file    PHYSICAL EXAM  GENERAL EXAM/CONSTITUTIONAL: Vitals:  Vitals:   01/05/25 1440  BP: 126/76  Pulse: 84  SpO2: 91%  Weight: 159 lb 8 oz (72.3 kg)  Height: 5' 3 (1.6 m)    Body mass index is 28.25 kg/m. Wt Readings from Last 3 Encounters:  01/05/25 159 lb 8 oz (72.3 kg)  10/10/24 160 lb 3.2 oz (72.7 kg)  05/12/24 166 lb (75.3 kg)   Patient is in no distress; well developed, nourished and groomed; neck is supple  MUSCULOSKELETAL: Gait, strength, tone, movements  noted in Neurologic exam below  NEUROLOGIC: MENTAL STATUS:      No data to display         awake, alert, oriented to person, place and time recent and remote memory intact normal attention and concentration language fluent, comprehension intact, naming intact fund of knowledge appropriate  CRANIAL NERVE:  2nd, 3rd, 4th, 6th - Visual fields full to confrontation, extraocular muscles intact, no nystagmus 5th - facial sensation symmetric 7th - facial strength symmetric 8th - hearing intact 9th - palate elevates symmetrically, uvula midline 11th - shoulder shrug symmetric 12th - tongue protrusion midline  MOTOR:  normal bulk and tone, full strength in the BUE, BLE  SENSORY:  normal and symmetric to light touch  COORDINATION:  finger-nose-finger, fine finger movements normal  GAIT/STATION:  normal   DIAGNOSTIC DATA (LABS, IMAGING, TESTING) - I reviewed patient records, labs, notes, testing and imaging myself where available.  Lab Results  Component Value Date   WBC 10.3 02/25/2024   HGB 12.1 02/25/2024   HCT 36.6 02/25/2024   MCV 89.1 02/25/2024   PLT 212 02/25/2024      Component Value Date/Time   NA 142 02/25/2024 0417   K 3.3 (L) 02/25/2024 0417   CL 112 (H) 02/25/2024 0417   CO2 23 02/25/2024 0417   GLUCOSE 95 02/25/2024 0417   BUN 6 (L) 02/25/2024 0417   CREATININE 0.63 02/25/2024 0417   CALCIUM 8.2 (L) 02/25/2024 0417   PROT 5.7 (L) 02/22/2024 1029   ALBUMIN 3.7 02/22/2024 1029   AST 20 02/22/2024 1029   ALT 19 02/22/2024 1029   ALKPHOS 42 02/22/2024 1029   BILITOT 0.5 02/22/2024 1029  GFRNONAA >60 02/25/2024 0417   No results found for: CHOL, HDL, LDLCALC, LDLDIRECT, TRIG No results found for: HGBA1C No results found for: VITAMINB12 Lab Results  Component Value Date   TSH 0.521 08/26/2022    MRI Brain 02/24/2024 1. No acute intracranial abnormality. 2. 2 cm meningioma over the anterior left frontal convexity and 1 cm  meningioma along the left anterior clinoid process. No significant mass effect or edema. 3. Mild chronic small vessel ischemic disease   rEEG 02/24/2024: Normal    ASSESSMENT AND PLAN  74 y.o. year old female  with history of depression, hyperlipidemia, COPD who is presenting for follow-up.  Since last visit she has not had any additional episode of syncope, no seizure-like episode and no episode of hypotension.  She is currently on levetiracetam  250 mg twice daily, denies any side effects.  Plan will be to obtain routine EEG, if normal we will further decrease the levetiracetam  to 125 mg twice daily for 1 month then stop the medication.  This was explained to the patient and her husband and they are both comfortable with plans.  Continue to follow with PCP return as needed   1. Seizure-like activity (HCC)   2. Syncope, unspecified syncope type      Patient Instructions  Routine EEG, I will contact you to go over the result If normal we will further decrease levetiracetam  to 125 mg twice daily for 1 month then stop the medication. Continue follow-up PCP Return as needed   Per [place] statutes, patients with seizures are not allowed to drive until they have been seizure-free for six months.  Other recommendations include using caution when using heavy equipment or power tools. Avoid working on ladders or at heights. Take showers instead of baths.  Do not swim alone.  Ensure the water temperature is not too high on the home water heater. Do not go swimming alone. Do not lock yourself in a room alone (i.e. bathroom). When caring for infants or small children, sit down when holding, feeding, or changing them to minimize risk of injury to the child in the event you have a seizure. Maintain good sleep hygiene. Avoid alcohol.  Also recommend adequate sleep, hydration, good diet and minimize stress.   During the Seizure  - First, ensure adequate ventilation and place patients on the  floor on their left side  Loosen clothing around the neck and ensure the airway is patent. If the patient is clenching the teeth, do not force the mouth open with any object as this can cause severe damage - Remove all items from the surrounding that can be hazardous. The patient may be oblivious to what's happening and may not even know what he or she is doing. If the patient is confused and wandering, either gently guide him/her away and block access to outside areas - Reassure the individual and be comforting - Call 911. In most cases, the seizure ends before EMS arrives. However, there are cases when seizures may last over 3 to 5 minutes. Or the individual may have developed breathing difficulties or severe injuries. If a pregnant patient or a person with diabetes develops a seizure, it is prudent to call an ambulance. - Finally, if the patient does not regain full consciousness, then call EMS. Most patients will remain confused for about 45 to 90 minutes after a seizure, so you must use judgment in calling for help. - Avoid restraints but make sure the patient is in a bed with  padded side rails - Place the individual in a lateral position with the neck slightly flexed; this will help the saliva drain from the mouth and prevent the tongue from falling backward - Remove all nearby furniture and other hazards from the area - Provide verbal assurance as the individual is regaining consciousness - Provide the patient with privacy if possible - Call for help and start treatment as ordered by the caregiver   After the Seizure (Postictal Stage)  After a seizure, most patients experience confusion, fatigue, muscle pain and/or a headache. Thus, one should permit the individual to sleep. For the next few days, reassurance is essential. Being calm and helping reorient the person is also of importance.  Most seizures are painless and end spontaneously. Seizures are not harmful to others but can lead to  complications such as stress on the lungs, brain and the heart. Individuals with prior lung problems may develop labored breathing and respiratory distress.    Discussed Patients with epilepsy have a small risk of sudden unexpected death, a condition referred to as sudden unexpected death in epilepsy (SUDEP). SUDEP is defined specifically as the sudden, unexpected, witnessed or unwitnessed, nontraumatic and nondrowning death in patients with epilepsy with or without evidence for a seizure, and excluding documented status epilepticus, in which post mortem examination does not reveal a structural or toxicologic cause for death     Orders Placed This Encounter  Procedures   EEG adult    No orders of the defined types were placed in this encounter.   Return if symptoms worsen or fail to improve.    Pastor Falling, MD 01/05/2025, 2:57 PM  [facility] [address], [address] [place], [place] [address] [phone]  "

## 2025-01-05 NOTE — Patient Instructions (Signed)
 Routine EEG, I will contact you to go over the result If normal we will further decrease levetiracetam  to 125 mg twice daily for 1 month then stop the medication. Continue follow-up PCP Return as needed

## 2025-01-12 ENCOUNTER — Other Ambulatory Visit: Admitting: *Deleted
